# Patient Record
Sex: Female | Born: 1975 | Race: Black or African American | Hispanic: No | Marital: Single | Smoking: Never smoker
Health system: Southern US, Community
[De-identification: ages and names within clinical notes are randomized; demographics above are authoritative.]

## PROBLEM LIST (undated history)

## (undated) DIAGNOSIS — Z903 Acquired absence of stomach [part of]: Secondary | ICD-10-CM

## (undated) DIAGNOSIS — B9689 Other specified bacterial agents as the cause of diseases classified elsewhere: Secondary | ICD-10-CM

## (undated) DIAGNOSIS — N760 Acute vaginitis: Secondary | ICD-10-CM

## (undated) HISTORY — DX: Acquired absence of stomach (part of): Z90.3

## (undated) HISTORY — PX: REMOVAL OF URINARY SLING: SHX6218

## (undated) HISTORY — PX: TUBAL LIGATION: SHX77

## (undated) HISTORY — PX: INCONTINENCE SURGERY: SHX676

## (undated) HISTORY — PX: ABDOMINAL HYSTERECTOMY: SHX81

---

## 2006-04-20 ENCOUNTER — Emergency Department (HOSPITAL_COMMUNITY): Admission: EM | Admit: 2006-04-20 | Discharge: 2006-04-20 | Payer: Self-pay | Admitting: Emergency Medicine

## 2007-12-02 ENCOUNTER — Other Ambulatory Visit: Admission: RE | Admit: 2007-12-02 | Discharge: 2007-12-02 | Payer: Self-pay | Admitting: Family Medicine

## 2009-05-31 ENCOUNTER — Ambulatory Visit: Payer: Self-pay | Admitting: Obstetrics & Gynecology

## 2009-06-07 ENCOUNTER — Ambulatory Visit: Payer: Self-pay | Admitting: Obstetrics & Gynecology

## 2009-08-10 ENCOUNTER — Ambulatory Visit: Payer: Self-pay | Admitting: Obstetrics & Gynecology

## 2009-08-16 ENCOUNTER — Ambulatory Visit: Payer: Self-pay | Admitting: Obstetrics & Gynecology

## 2013-03-19 ENCOUNTER — Ambulatory Visit: Payer: Self-pay | Admitting: Family Medicine

## 2013-03-19 LAB — WET PREP, GENITAL

## 2014-10-06 ENCOUNTER — Ambulatory Visit: Payer: Self-pay | Admitting: Specialist

## 2014-10-11 ENCOUNTER — Ambulatory Visit: Payer: Self-pay | Admitting: Specialist

## 2014-10-20 ENCOUNTER — Ambulatory Visit: Payer: Self-pay | Admitting: Specialist

## 2014-12-10 ENCOUNTER — Ambulatory Visit
Admission: EM | Admit: 2014-12-10 | Discharge: 2014-12-10 | Disposition: A | Discharge status: Home / Self Care | Payer: BLUE CROSS/BLUE SHIELD | Attending: Internal Medicine | Admitting: Internal Medicine

## 2014-12-10 DIAGNOSIS — N762 Acute vulvitis: Secondary | ICD-10-CM | POA: Diagnosis not present

## 2014-12-10 DIAGNOSIS — N760 Acute vaginitis: Secondary | ICD-10-CM

## 2014-12-10 DIAGNOSIS — L292 Pruritus vulvae: Secondary | ICD-10-CM | POA: Diagnosis present

## 2014-12-10 DIAGNOSIS — R32 Unspecified urinary incontinence: Secondary | ICD-10-CM | POA: Insufficient documentation

## 2014-12-10 LAB — CHLAMYDIA/NGC RT PCR (ARMC ONLY)
Chlamydia Tr: NOT DETECTED
N gonorrhoeae: NOT DETECTED

## 2014-12-10 LAB — WET PREP, GENITAL
Clue Cells Wet Prep HPF POC: NONE SEEN
TRICH WET PREP: NONE SEEN
Yeast Wet Prep HPF POC: NONE SEEN

## 2014-12-10 MED ORDER — HYDROCORTISONE VALERATE 0.2 % EX OINT: 1.0000 "application " | TOPICAL_OINTMENT | Freq: Two times a day (BID) | CUTANEOUS | Status: AC

## 2014-12-10 NOTE — ED Provider Notes (Signed)
CSN: [id number]     Arrival date & time 12/10/14  0913 History   None    Chief Complaint  Patient presents with  . Vaginal Itching   HPI Patient presents with three-week history of intermittent severe vaginal itching, not internal, primarily at the introitus. Itching was severe initially, lasted for about a week, subsided, and now has returned in the last week. No unusual vaginal discharge, no odor. Chronic difficulty with some urinary incontinence. Patient describes herself as being sort of obsessive about cleanliness, because of the urinary incontinence issue. She uses an antibacterial soap; not currently using wipes. She does not douche. No change in urinary frequency, no dysuria. No change in bowel habits. No vaginal bleeding (status post hysterectomy, still has ovaries). No hot flashes. Feels okay otherwise, no fever. No nausea, vomiting. No abdominal or pelvic pain.   History reviewed. No pertinent past medical history. Past Surgical History  Procedure Laterality Date  . Abdominal hysterectomy    . Incontinence surgery    . Tubal ligation    . Removal of urinary sling     No family history on file. History  Substance Use Topics  . Smoking status: Never Smoker   . Smokeless tobacco: Never Used  . Alcohol Use: Yes     Comment: occasionally   OB History    No data available     Review of Systems  All other systems reviewed and are negative.   Allergies  Rocephin  Home Medications   Prior to Admission medications   Medication Sig Start Date End Date Taking? Authorizing Provider  fluticasone (FLONASE) 50 MCG/ACT nasal spray Place into both nostrils daily.   Yes Historical Provider, MD  ranitidine (ZANTAC) 150 MG capsule Take 150 mg by mouth 2 (two) times daily.   Yes Historical Provider, MD   BP 146/93 mmHg  Pulse 77  Temp(Src) 98.5 F (36.9 C) (Oral)  Ht 5' (1.524 m)  Wt 211 lb (95.709 kg)  BMI 41.21 kg/m2  SpO2 100% Physical Exam  Constitutional: She is  oriented to person, place, and time. No distress.  Alert, nicely groomed  HENT:  Head: Atraumatic.  Eyes:  Conjugate gaze, no eye redness/drainage  Neck: Neck supple.  Cardiovascular: Normal rate.   Pulmonary/Chest: No respiratory distress.  Abdominal: She exhibits no distension.  Genitourinary:  Speculum exam vaginal mucosa is unremarkable, small amount of creamy white discharge, no odor. Swabs obtained for GC/chlamydia, wet prep. Introitus is erythematous, irregular, without ulceration, without induration. Vulvar exam is unremarkable  Musculoskeletal: Normal range of motion.  No leg swelling  Neurological: She is alert and oriented to person, place, and time.  Skin: Skin is warm and dry.  No cyanosis  Nursing note and vitals reviewed.   ED Course  Procedures (including critical care time) Labs Review Labs Reviewed  WET PREP, GENITAL - Abnormal; Notable for the following:    WBC, Wet Prep HPF POC FEW (*)    All other components within normal limits  CHLAMYDIA/NGC RT PCR [facility])     Imaging Review No results found.   MDM   1. Acute vulvitis or vulvovaginitis    Presumed irritant source, from the antibiotic soap. Have encouraged patient to use only water to wash, particularly until symptoms subside. No body washes, wipes, hygiene products. Rx for Westcort ointment twice a day for 10 days. If symptoms persist, follow up with PCP/Sarah Gauger or GYN to discuss further diagnosis and treatment options. Might benefit from topical estrogen.  Eustace MooreLaura W Ethyn Schetter, MD 12/10/14 1125

## 2014-12-10 NOTE — Discharge Instructions (Signed)
Use a small amount of the hydrocortisone ointment twice daily to the itchy area for 10 days only.  If itching/irritation persist, see your PCP/Sarah Gauger or followup with a gynecologist for discussion of further diagnosis/treatment options.  Vaginitis Vaginitis is an inflammation of the vagina. It can happen when the normal bacteria and yeast in the vagina grow too much. There are different types. Treatment will depend on the type you have. HOME CARE  Take all medicines as told by your doctor.  Keep your vagina area clean and dry. Avoid soap. Rinse the area with water.  Avoid washing and cleaning out the vagina (douching).  Do not use tampons or have sex (intercourse) until your treatment is done.  Wipe from front to back after going to the restroom.  Wear cotton underwear.  Avoid wearing underwear while you sleep until your vaginitis is gone.  Avoid tight pants. Avoid underwear or nylons without a cotton panel.  Take off wet clothing (such as a bathing suit) as soon as you can.  Use mild, unscented products. Avoid fabric softeners and scented:  Feminine sprays.  Laundry detergents.  Tampons.  Soaps or bubble baths.  Practice safe sex and use condoms. GET HELP RIGHT AWAY IF:   You have belly (abdominal) pain.  You have a fever or lasting symptoms for more than 2-3 days.  You have a fever and your symptoms suddenly get worse. MAKE SURE YOU:   Understand these instructions.  Will watch this condition.  Will get help right away if you are not doing well or get worse. Document Released: 10/17/2008 Document Revised: 04/14/2012 Document Reviewed: 01/01/2012 [facility] Patient Information 2015 [place]. This information is not intended to replace advice given to you by your health care provider. Make sure you discuss any questions you have with your health care provider.

## 2014-12-10 NOTE — ED Notes (Signed)
X 3 weeks. Pt denies abnormal discharge or malodor.

## 2015-08-19 ENCOUNTER — Emergency Department: Payer: BLUE CROSS/BLUE SHIELD

## 2015-08-19 ENCOUNTER — Emergency Department
Admission: EM | Admit: 2015-08-19 | Discharge: 2015-08-19 | Disposition: A | Discharge status: Home / Self Care | Payer: BLUE CROSS/BLUE SHIELD | Attending: Emergency Medicine | Admitting: Emergency Medicine

## 2015-08-19 ENCOUNTER — Encounter: Payer: Self-pay | Admitting: Emergency Medicine

## 2015-08-19 DIAGNOSIS — R1011 Right upper quadrant pain: Secondary | ICD-10-CM

## 2015-08-19 DIAGNOSIS — M549 Dorsalgia, unspecified: Secondary | ICD-10-CM | POA: Insufficient documentation

## 2015-08-19 DIAGNOSIS — F419 Anxiety disorder, unspecified: Secondary | ICD-10-CM | POA: Insufficient documentation

## 2015-08-19 DIAGNOSIS — Z3202 Encounter for pregnancy test, result negative: Secondary | ICD-10-CM | POA: Insufficient documentation

## 2015-08-19 DIAGNOSIS — Z79899 Other long term (current) drug therapy: Secondary | ICD-10-CM | POA: Insufficient documentation

## 2015-08-19 DIAGNOSIS — R1013 Epigastric pain: Secondary | ICD-10-CM | POA: Diagnosis present

## 2015-08-19 LAB — URINALYSIS COMPLETE WITH MICROSCOPIC (ARMC ONLY)
Bacteria, UA: NONE SEEN
Bilirubin Urine: NEGATIVE
GLUCOSE, UA: NEGATIVE mg/dL
HGB URINE DIPSTICK: NEGATIVE
Ketones, ur: NEGATIVE mg/dL
Leukocytes, UA: NEGATIVE
Nitrite: NEGATIVE
PROTEIN: NEGATIVE mg/dL
Specific Gravity, Urine: 1.024 (ref 1.005–1.030)
pH: 5 (ref 5.0–8.0)

## 2015-08-19 LAB — CBC
HEMATOCRIT: 41.3 % (ref 35.0–47.0)
HEMOGLOBIN: 13.1 g/dL (ref 12.0–16.0)
MCH: 26 pg (ref 26.0–34.0)
MCHC: 31.8 g/dL — ABNORMAL LOW (ref 32.0–36.0)
MCV: 81.9 fL (ref 80.0–100.0)
Platelets: 176 10*3/uL (ref 150–440)
RBC: 5.04 MIL/uL (ref 3.80–5.20)
RDW: 14.9 % — ABNORMAL HIGH (ref 11.5–14.5)
WBC: 11.8 10*3/uL — AB (ref 3.6–11.0)

## 2015-08-19 LAB — COMPREHENSIVE METABOLIC PANEL
ALT: 95 U/L — AB (ref 14–54)
ANION GAP: 7 (ref 5–15)
AST: 199 U/L — ABNORMAL HIGH (ref 15–41)
Albumin: 4.2 g/dL (ref 3.5–5.0)
Alkaline Phosphatase: 74 U/L (ref 38–126)
BUN: 18 mg/dL (ref 6–20)
CHLORIDE: 103 mmol/L (ref 101–111)
CO2: 26 mmol/L (ref 22–32)
Calcium: 9.5 mg/dL (ref 8.9–10.3)
Creatinine, Ser: 0.88 mg/dL (ref 0.44–1.00)
GFR calc non Af Amer: >60 mL/min (ref >60)
Glucose, Bld: 90 mg/dL (ref 65–99)
POTASSIUM: 3.5 mmol/L (ref 3.5–5.1)
SODIUM: 136 mmol/L (ref 135–145)
Total Bilirubin: 0.9 mg/dL (ref 0.3–1.2)
Total Protein: 8 g/dL (ref 6.5–8.1)

## 2015-08-19 LAB — TROPONIN I: Troponin I: <0.03 ng/mL (ref <0.031)

## 2015-08-19 LAB — POCT PREGNANCY, URINE: Preg Test, Ur: NEGATIVE

## 2015-08-19 LAB — LIPASE, BLOOD: LIPASE: 22 U/L (ref 11–51)

## 2015-08-19 MED ORDER — IOHEXOL 240 MG/ML SOLN
25.0000 mL | Freq: Once | INTRAMUSCULAR | Status: AC | PRN
  Administered 2015-08-19: 25 mL via ORAL

## 2015-08-19 MED ORDER — IOHEXOL 300 MG/ML  SOLN
100.0000 mL | Freq: Once | INTRAMUSCULAR | Status: AC | PRN
  Administered 2015-08-19: 100 mL via INTRAVENOUS

## 2015-08-19 NOTE — ED Notes (Signed)
Pt. States epigastric pain that started at 6 am today.  Pt. States having gastric sleeve placed in June of last year.  Pt. States having hx of acid reflux before surgery.

## 2015-08-19 NOTE — Discharge Instructions (Signed)
You were evaluated for upper abdominal pain and although no certain cause was found, your exam and evaluation are reassuring today in the emergency department.  Return to the emergency department for any new or worsening condition including any worsening abdominal pain, fever, black or bloody stool, vomiting blood, or any other symptoms concerning to you.   Abdominal Pain, Adult Many things can cause belly (abdominal) pain. Most times, the belly pain is not dangerous. Many cases of belly pain can be watched and treated at home. HOME CARE   Do not take medicines that help you go poop (laxatives) unless told to by your doctor.  Only take medicine as told by your doctor.  Eat or drink as told by your doctor. Your doctor will tell you if you should be on a special diet. GET HELP IF:  You do not know what is causing your belly pain.  You have belly pain while you are sick to your stomach (nauseous) or have runny poop (diarrhea).  You have pain while you pee or poop.  Your belly pain wakes you up at night.  You have belly pain that gets worse or better when you eat.  You have belly pain that gets worse when you eat fatty foods.  You have a fever. GET HELP RIGHT AWAY IF:   The pain does not go away within 2 hours.  You keep throwing up (vomiting).  The pain changes and is only in the right or left part of the belly.  You have bloody or tarry looking poop. MAKE SURE YOU:   Understand these instructions.  Will watch your condition.  Will get help right away if you are not doing well or get worse.   This information is not intended to replace advice given to you by your health care provider. Make sure you discuss any questions you have with your health care provider.   Document Released: 01/07/2008 Document Revised: 08/11/2014 Document Reviewed: 03/30/2013 Elsevier Interactive Patient Education [employer]2016 [employer] Inc.

## 2015-08-19 NOTE — ED Notes (Signed)
Pt. States upper gastric pain radiates to rt. Flank.

## 2015-08-19 NOTE — ED Provider Notes (Signed)
[facility] Emergency Department Provider Note   ____________________________________________  Time seen: Approximately 7:20 AM I have reviewed the triage vital signs and the triage nursing note.  HISTORY  Chief Complaint Abdominal Pain   Historian Patient  HPI Jasmine Simon is a 40 y.o. female with a history of gastric sleeve surgery who is here with epigastric and right upper quadrant pain that started when she woke up this morning. Gastric sleeve was placed last June. Prior to that she had acid reflux, but states this episode did not feel like acid reflux. States it was a squeezing epigastric and right upper quadrant pain that was moderate to severe. She to get a shower it was still not gone and so she came in for evaluation. She states it is almost completely relieved, but still some dull pain in the right upper quadrant now. No fever or vomiting. No stool changes. No alleviating or aggravating factors.    History reviewed. No pertinent past medical history.  There are no active problems to display for this patient.   Past Surgical History  Procedure Laterality Date  . Abdominal hysterectomy    . Incontinence surgery    . Tubal ligation    . Removal of urinary sling      Current Outpatient Rx  Name  Route  Sig  Dispense  Refill  . Biotin 10 MG TABS   Oral   Take 10 mg by mouth daily.         . calcium-vitamin D (OSCAL WITH D) 500-200 MG-UNIT tablet   Oral   Take 1 tablet by mouth daily.         . fluticasone (FLONASE) 50 MCG/ACT nasal spray   Each Nare   Place 1 spray into both nostrils daily as needed for allergies or rhinitis.          . Multiple Vitamin (MULTI-VITAMINS) TABS   Oral   Take 1 tablet by mouth daily.         . ranitidine (ZANTAC) 150 MG capsule   Oral   Take 150 mg by mouth 2 (two) times daily.           Allergies Rocephin  Family History  Problem Relation Age of Onset  . Heart failure Mother   .  Cancer Other     Social History Social History  Substance Use Topics  . Smoking status: Never Smoker   . Smokeless tobacco: Never Used  . Alcohol Use: Yes     Comment: occasionally    Review of Systems  Constitutional: Negative for fever. Eyes: Negative for visual changes. ENT: Negative for sore throat. Cardiovascular: Negative for chest pain. Respiratory: Negative for shortness of breath. Gastrointestinal: Negative for  vomiting and diarrhea. Genitourinary: Negative for dysuria. Musculoskeletal: Initially felt mid back pain and then started to feel the abdominal pain. Skin: Negative for rash. Neurological: Negative for headache. 10 point Review of Systems otherwise negative ____________________________________________   PHYSICAL EXAM:  VITAL SIGNS: ED Triage Vitals  Enc Vitals Group     BP 08/19/15 0656 132/93 mmHg     Pulse Rate 08/19/15 0656 100     Resp 08/19/15 0656 22     Temp 08/19/15 0656 98.3 F (36.8 C)     Temp Source 08/19/15 0656 Oral     SpO2 08/19/15 0656 100 %     Weight 08/19/15 0656 166 lb (75.297 kg)     Height 08/19/15 0656 5' (1.524 m)  Head Cir --      Peak Flow --      Pain Score 08/19/15 0658 8     Pain Loc --      Pain Edu? --      Excl. in GC? --      Constitutional: Alert and oriented. Well appearing and in no distress. Somewhat anxious about receiving an IV Eyes: Conjunctivae are normal. PERRL. Normal extraocular movements. ENT   Head: Normocephalic and atraumatic.   Nose: No congestion/rhinnorhea.   Mouth/Throat: Mucous membranes are moist.   Neck: No stridor. Cardiovascular/Chest: Normal rate, regular rhythm.  No murmurs, rubs, or gallops. Respiratory: Normal respiratory effort without tachypnea nor retractions. Breath sounds are clear and equal bilaterally. No wheezes/rales/rhonchi. Gastrointestinal: Soft. No distention, no guarding, no rebound. Moderate tenderness in the epigastrium and right upper quadrant. No  lower abdominal tenderness palpation.  Genitourinary/rectal:Deferred Musculoskeletal: Nontender with normal range of motion in all extremities. No joint effusions.  No lower extremity tenderness.  No edema. Neurologic:  Normal speech and language. No gross or focal neurologic deficits are appreciated. Skin:  Skin is warm, dry and intact. No rash noted. Psychiatric: Simon and affect are normal. Speech and behavior are normal. Patient exhibits appropriate insight and judgment.  ____________________________________________   EKG I, Governor Rooksebecca Noor Witte, MD, the attending physician have personally viewed and interpreted all ECGs.  91 bpm. Normal sinus rhythm. Narrow QRS. Normal axis. Normal ST and T-wave ____________________________________________  LABS (pertinent positives/negatives)  Lipase 22 Copy intensive metabolic panel significant for AST 199, a LT 95 otherwise within normal limits White blood count 11.8, hemoglobin 13.1 and platelet count 136 Troponin less than 0.03 Urinalysis negative  ____________________________________________  RADIOLOGY All Xrays were viewed by me. Imaging interpreted by Radiologist.  Abdominal x-ray:  Nonspecific, nonobstructive bowel gas pattern. No acute cardiopulmonary disease  CT abdomen and pelvis with contrast:IMPRESSION: No acute findings within the abdomen or pelvis.  Moderate to large stool burden noted; suggest clinical correlation for possible constipation.  Right lower quadrant ultrasound:  IMPRESSION: Normal gallbladder. No liver abnormality. __________________________________________  PROCEDURES  Procedure(s) performed: None  Critical Care performed: None  ____________________________________________   ED COURSE / ASSESSMENT AND PLAN  CONSULTATIONS: None  Pertinent labs & imaging results that were available during my care of the patient were reviewed by me and considered in my medical decision making (see chart for  details).  Although the pain has significantly eased off, she still has tenderness in the epigastrium and right upper quadrant on palpation. Labs and x-ray will be obtained initially. Consider complication from gastric sleeve versus GERD versus biliary etiology.  Minimally elevated white blood cell count, and AST LT minimally elevated as well. I discussed with patient obtaining CT of abdomen and pelvis. We discussed risk versus benefit and chose to proceed.  CT the abdomen showed no acute source for pain or complication of the gastric sleeve.  Given the elevated LFTs and elevated white blood cell count and right upper quadrant pain, I did follow this with imaging of the gallbladder with ultrasound.  ----------------------------------------- 3:25 PM on 08/19/2015 -----------------------------------------  Patient continues to feel well. I discussed her reassuring results on labs and imaging. I feel comfortable at this point having her discharged home and follow up with her primary care physician.  Patient / Family / Caregiver informed of clinical course, medical decision-making process, and agree with plan.   I discussed return precautions, follow-up instructions, and discharged instructions with patient and/or family.  ___________________________________________   FINAL  CLINICAL IMPRESSION(S) / ED DIAGNOSES   Final diagnoses:  RUQ pain              Note: This dictation was prepared with Dragon dictation. Any transcriptional errors that result from this process are unintentional   Governor Rooks, MD 08/19/15 1526

## 2016-04-08 IMAGING — CR DG ABDOMEN ACUTE W/ 1V CHEST
4 series · 4 of 4 positions shown · non-contrast
Comparison: Chest x-ray 10/06/2014

CLINICAL DATA: Severe epigastric pain and mid to upper back pain
beginning this morning. Gastric sleeve surgery January 2015.

EXAM:
DG ABDOMEN ACUTE W/ 1V CHEST

[chest pa]
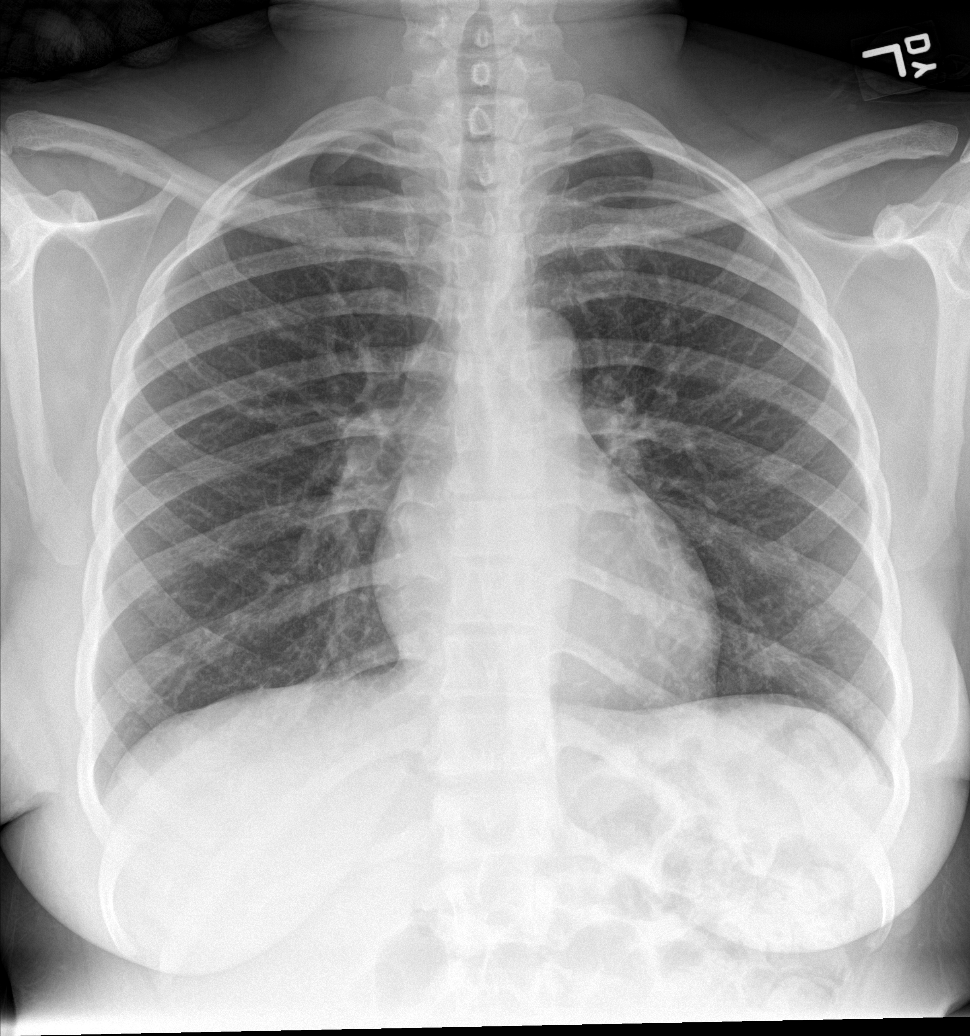

[abdomen erect]
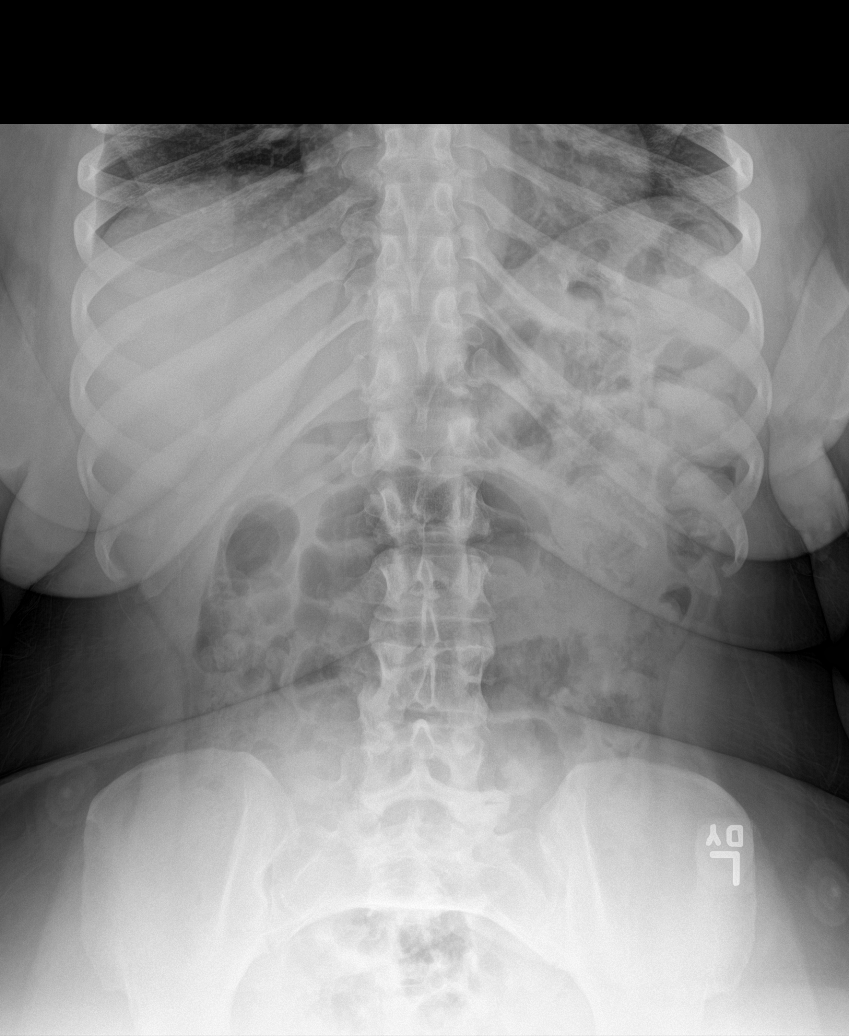

[abdomen supine (1 of 2)]
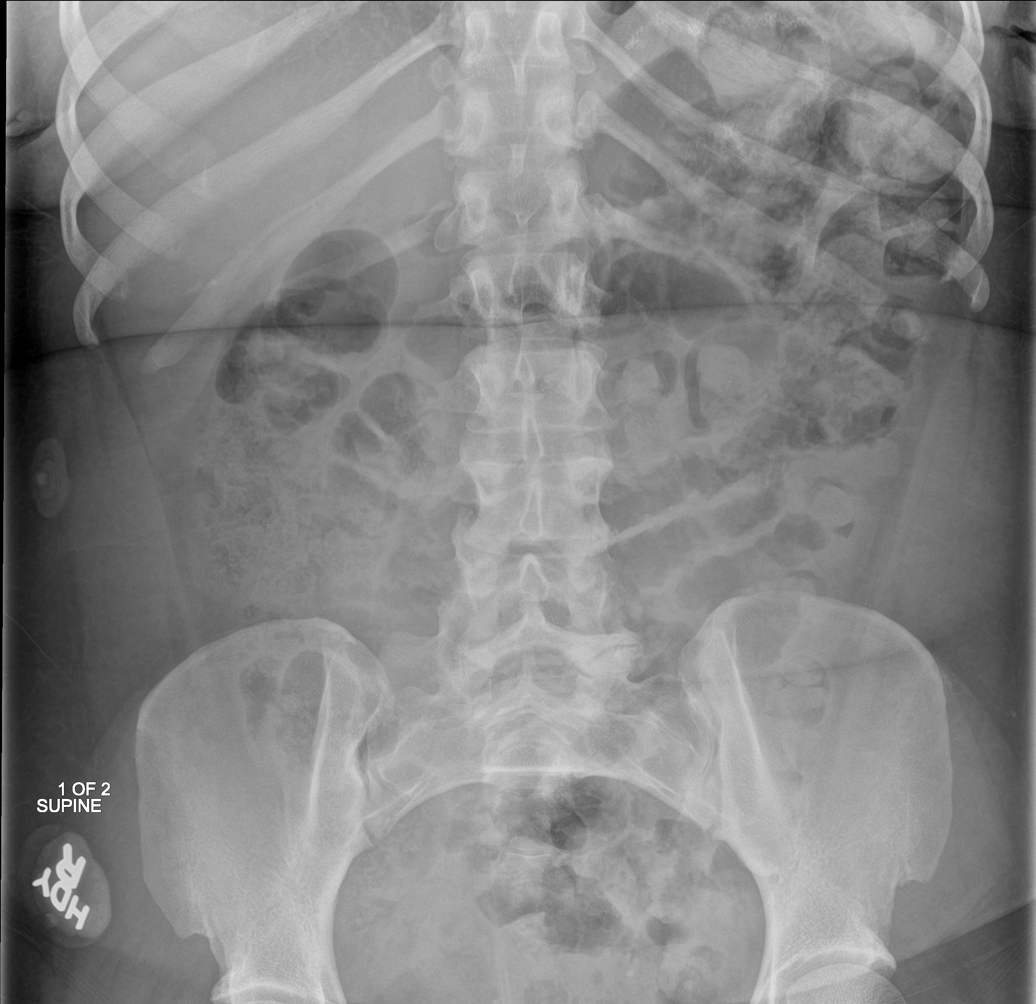

[abdomen supine (2 of 2)]
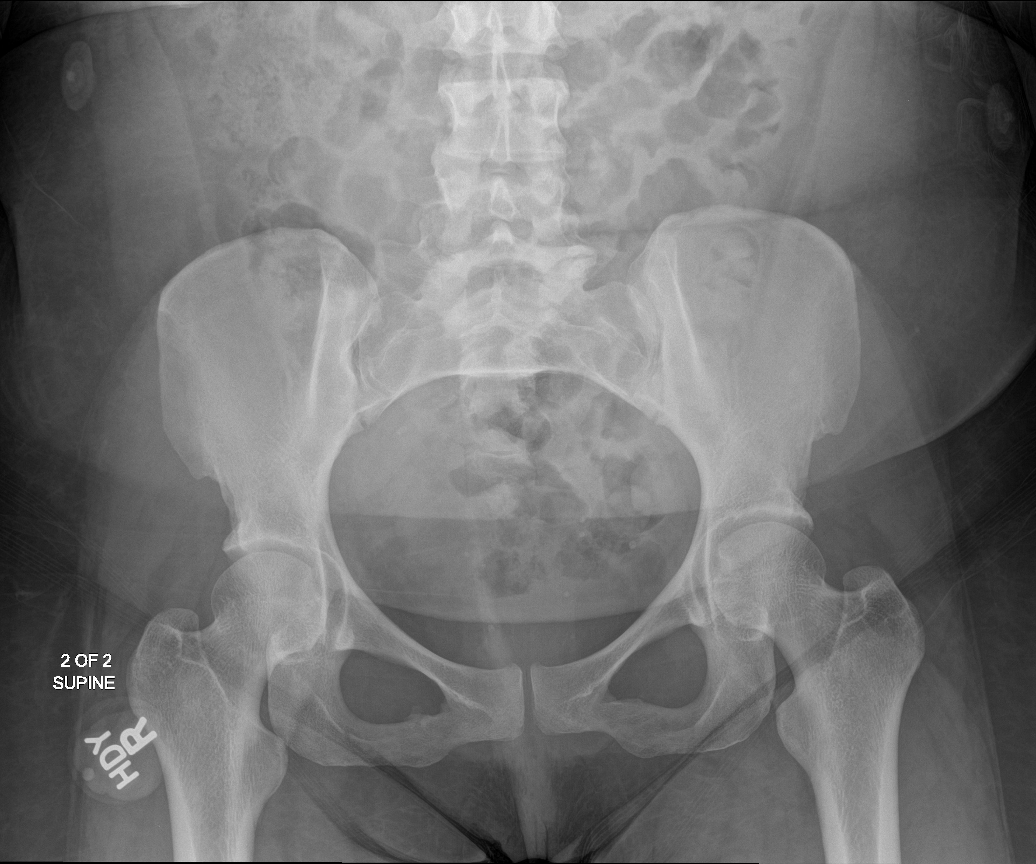

[4 of 4 positions shown; findings below may reference images not displayed]

FINDINGS: Lungs are adequately inflated and otherwise clear. Cardiomediastinal
silhouette and remainder of the chest is unchanged.

Abdominal films demonstrate air in stool throughout the colon. There
are a few air-filled nondilated small bowel loops over the left
abdomen. No free peritoneal air. No air-fluid levels. Remaining
bones and soft tissues are unremarkable.
IMPRESSION: Nonspecific, nonobstructive bowel gas pattern.

No acute cardiopulmonary disease.

## 2016-09-05 DIAGNOSIS — Z5181 Encounter for therapeutic drug level monitoring: Secondary | ICD-10-CM | POA: Diagnosis not present

## 2016-09-05 DIAGNOSIS — Z9884 Bariatric surgery status: Secondary | ICD-10-CM | POA: Diagnosis not present

## 2016-09-05 DIAGNOSIS — R638 Other symptoms and signs concerning food and fluid intake: Secondary | ICD-10-CM | POA: Diagnosis not present

## 2016-09-05 DIAGNOSIS — K912 Postsurgical malabsorption, not elsewhere classified: Secondary | ICD-10-CM | POA: Diagnosis not present

## 2016-09-09 DIAGNOSIS — Z90710 Acquired absence of both cervix and uterus: Secondary | ICD-10-CM | POA: Diagnosis not present

## 2016-09-09 DIAGNOSIS — Z713 Dietary counseling and surveillance: Secondary | ICD-10-CM | POA: Diagnosis not present

## 2016-09-09 DIAGNOSIS — R102 Pelvic and perineal pain: Secondary | ICD-10-CM | POA: Diagnosis not present

## 2016-09-24 IMAGING — US US ABDOMEN LIMITED
1 series · 14 of 25 positions shown · non-contrast
Comparison: CT 08/19/2015

CLINICAL DATA: RIGHT upper quadrant pain.

EXAM:
US ABDOMEN LIMITED - RIGHT UPPER QUADRANT

[Series 1: us abdomen limited · 0.15mm/px · 14 of 43 slices shown]
[im 1/43]
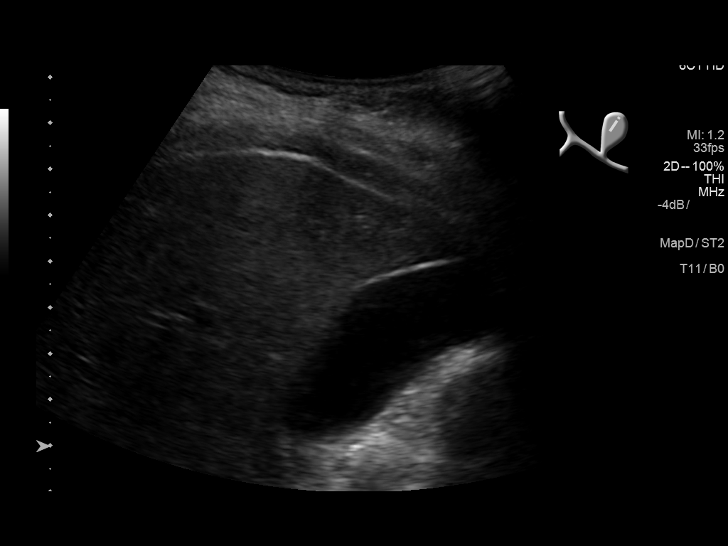
[im 4/43]
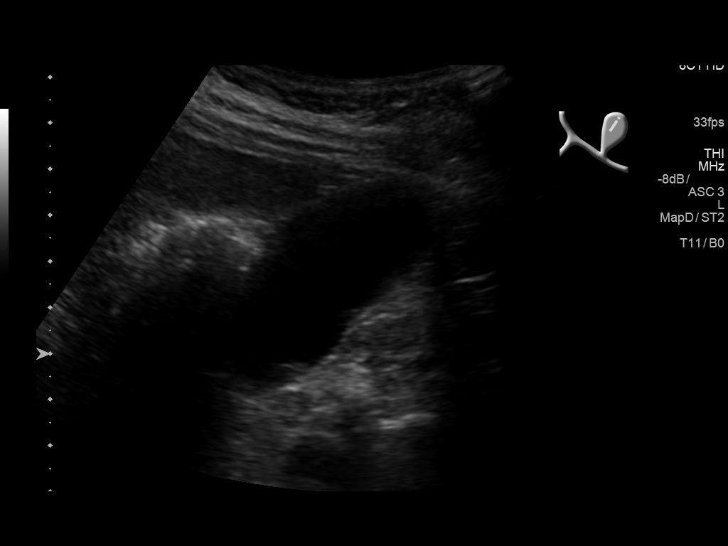
[im 8/43]
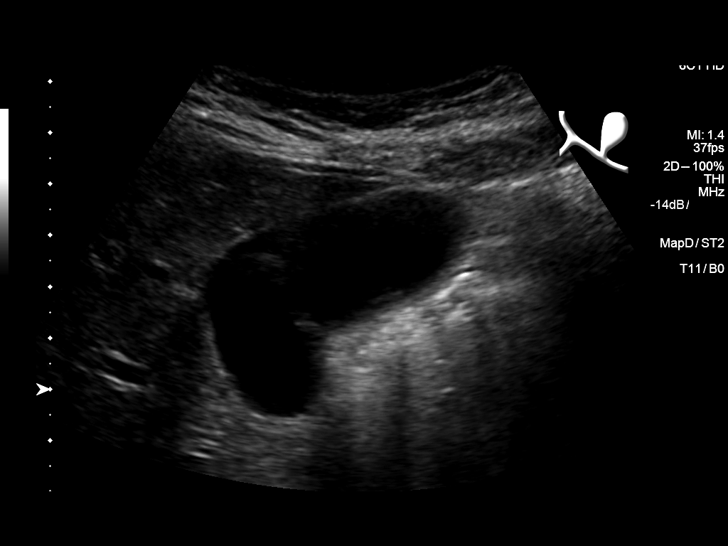
[im 11/43]
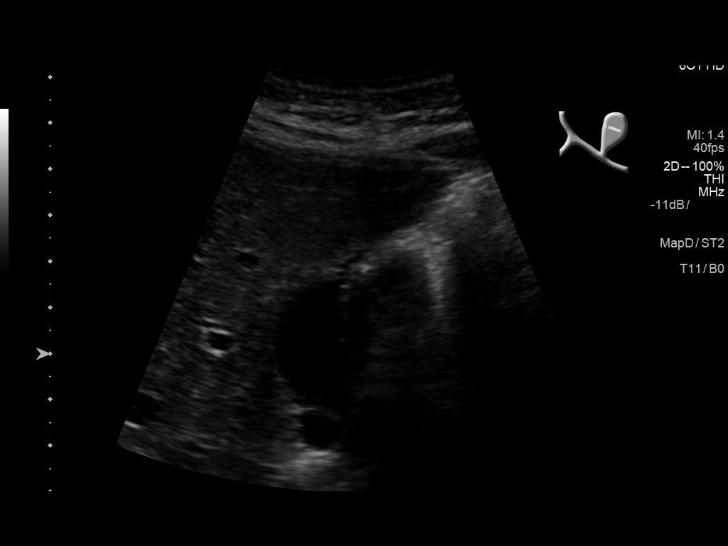
[im 15/43]
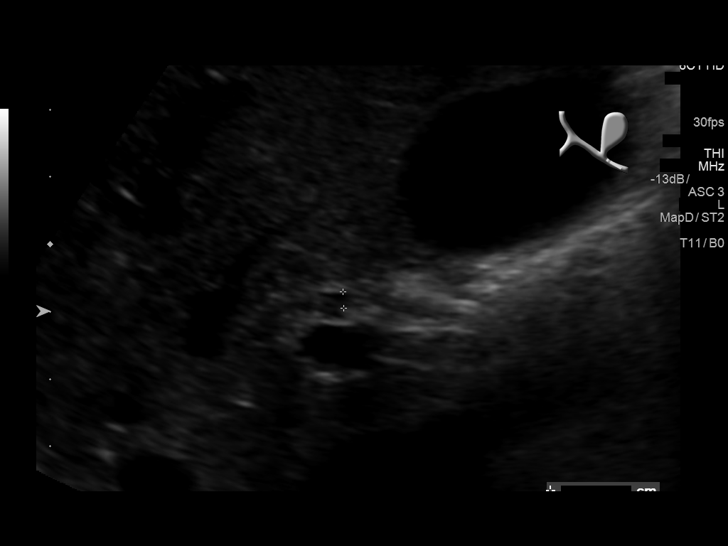
[im 16/43]
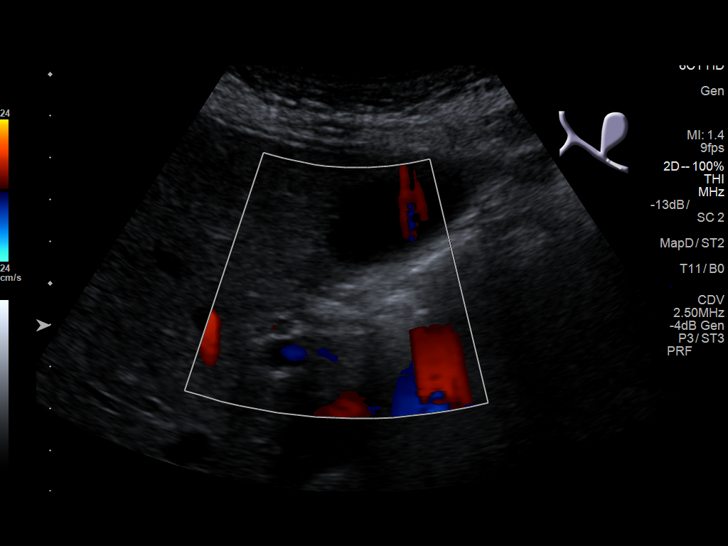
[im 20/43]
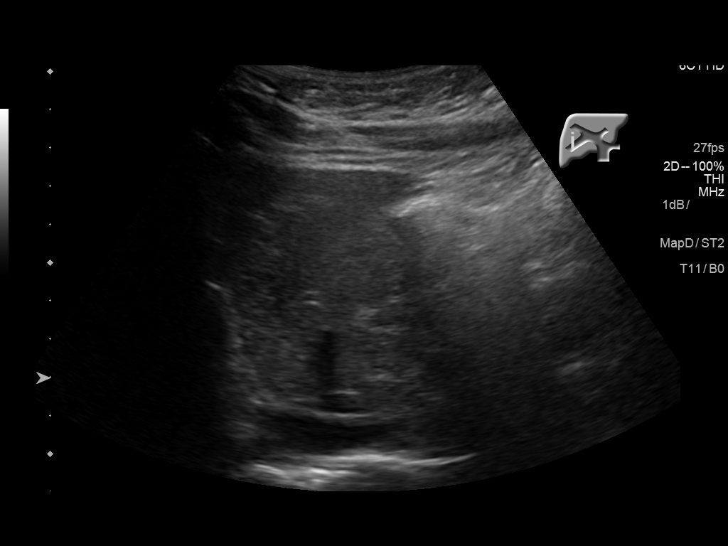
[im 23/43]
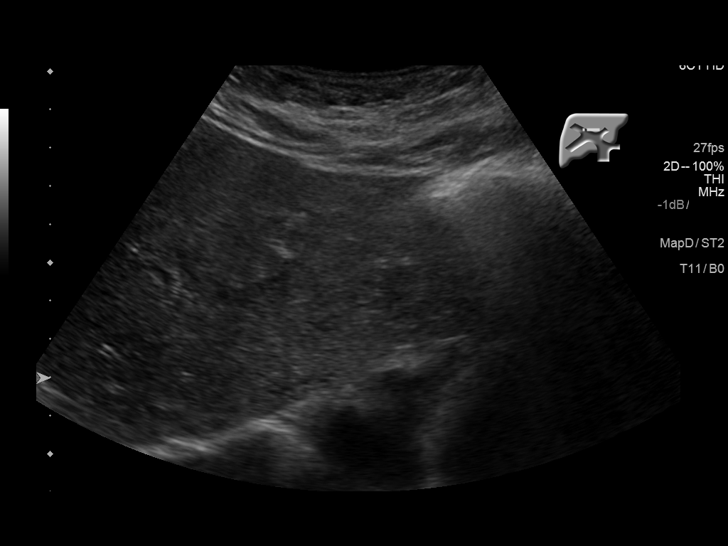
[im 27/43]
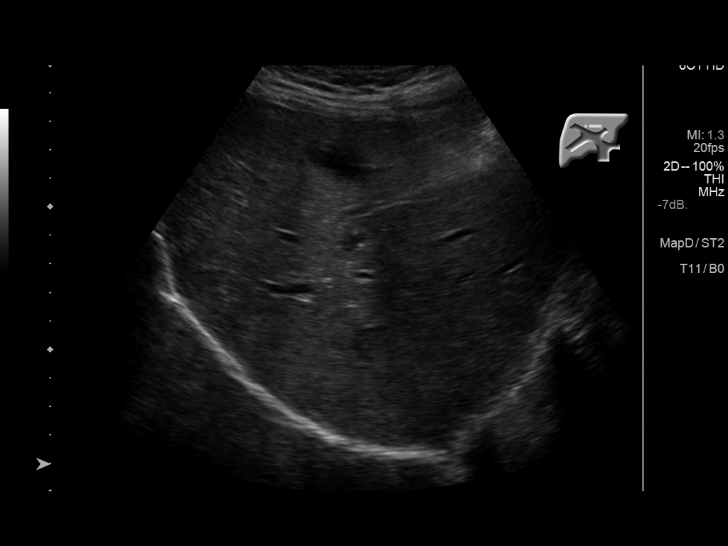
[im 29/43]
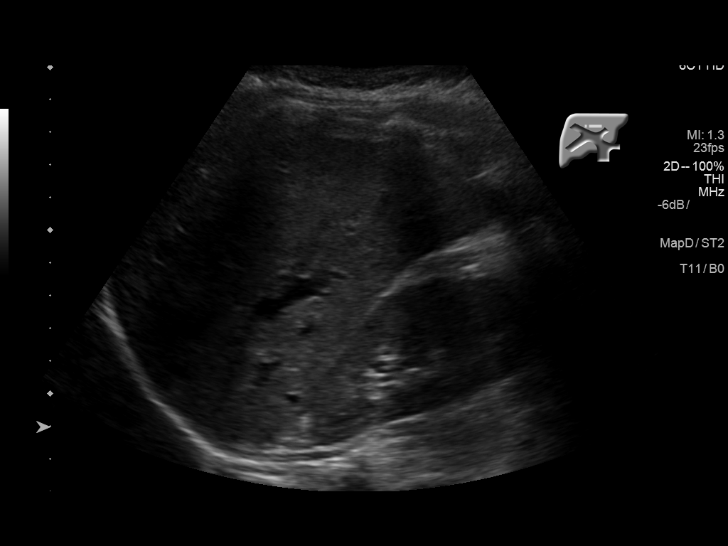
[im 32/43]
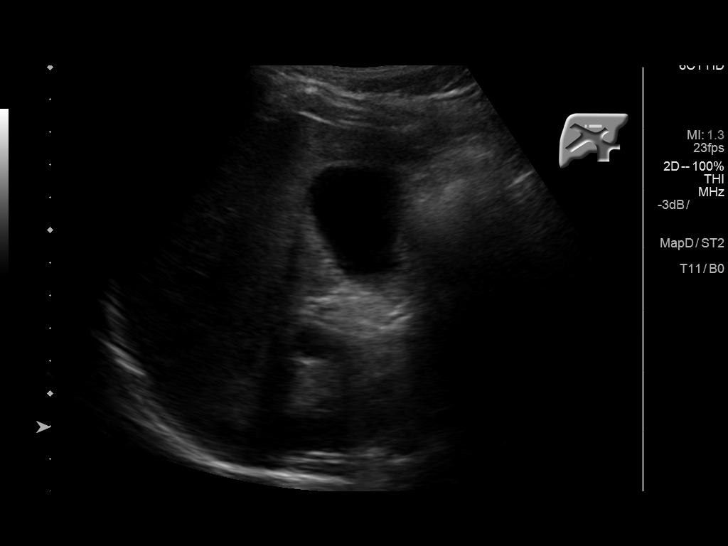
[im 36/43]
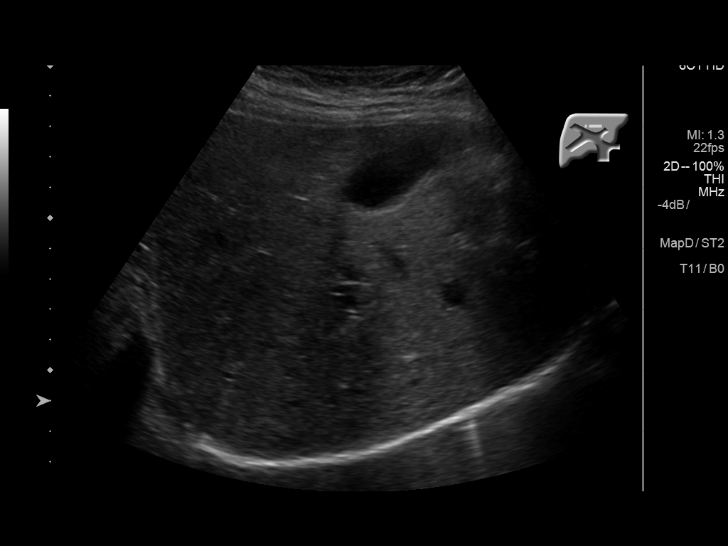
[im 39/43]
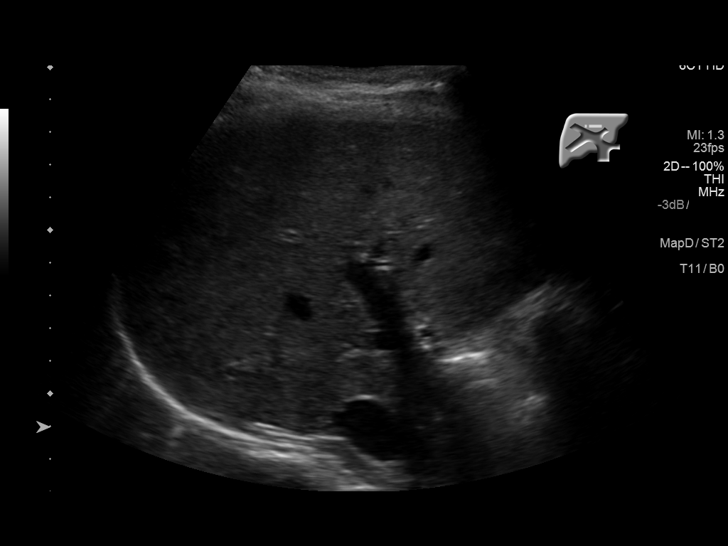
[im 43/43]
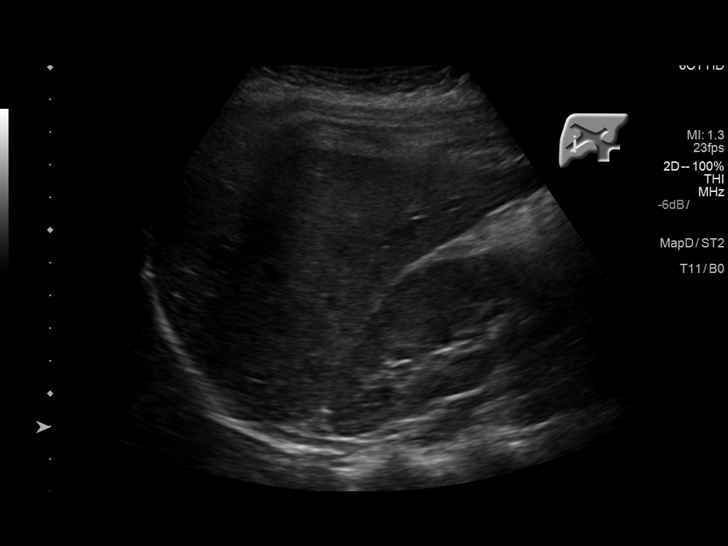

[14 of 25 positions shown; findings below may reference images not displayed]

FINDINGS: Gallbladder:

No gallstones or wall thickening visualized. No sonographic Murphy
sign noted by sonographer.

Common bile duct:

Diameter: Normal at 2.4 cm.

Liver:

No focal lesion identified. Within normal limits in parenchymal
echogenicity.
IMPRESSION: Normal gallbladder.  No liver abnormality.

## 2016-09-28 ENCOUNTER — Ambulatory Visit
Admission: EM | Admit: 2016-09-28 | Discharge: 2016-09-28 | Disposition: A | Discharge status: Home / Self Care | Payer: 59 | Attending: Family Medicine | Admitting: Family Medicine

## 2016-09-28 DIAGNOSIS — B3731 Acute candidiasis of vulva and vagina: Secondary | ICD-10-CM

## 2016-09-28 DIAGNOSIS — B373 Candidiasis of vulva and vagina: Secondary | ICD-10-CM

## 2016-09-28 LAB — WET PREP, GENITAL
Clue Cells Wet Prep HPF POC: NONE SEEN
Sperm: NONE SEEN
Trich, Wet Prep: NONE SEEN
Yeast Wet Prep HPF POC: NONE SEEN

## 2016-09-28 LAB — CHLAMYDIA/NGC RT PCR (ARMC ONLY)
Chlamydia Tr: NOT DETECTED
N gonorrhoeae: NOT DETECTED

## 2016-09-28 MED ORDER — FLUCONAZOLE 150 MG PO TABS: 150.0000 mg | ORAL_TABLET | Freq: Every day | ORAL | 1 refills | Status: DC

## 2016-09-28 NOTE — ED Provider Notes (Signed)
CSN: [id number]     Arrival date & time 09/28/16  1242 History   First MD Initiated Contact with Patient 09/28/16 1329     Chief Complaint  Patient presents with  . Vaginal Discharge   (Consider location/radiation/quality/duration/timing/severity/associated sxs/prior Treatment) HPI  41 year old female who presents with a vaginal discharge that has the consistency of cottage cheese. Had for months. She denies any itching burning there is no odor. Is any dyspareunia. Not been using any over-the-counter medications to treat this. She does on occasion use feminine sprays and products because she has an issue with her from urinary incontinence. Said no fever or chills. Denies any urinary symptoms.        History reviewed. No pertinent past medical history. Past Surgical History:  Procedure Laterality Date  . ABDOMINAL HYSTERECTOMY    . INCONTINENCE SURGERY    . REMOVAL OF URINARY SLING    . TUBAL LIGATION     Family History  Problem Relation Age of Onset  . Cancer Other   . Heart failure Mother   . Diabetes Mother   . Hypertension Mother   . Hypertension Father   . Alcohol abuse Father    Social History  Substance Use Topics  . Smoking status: Never Smoker  . Smokeless tobacco: Never Used  . Alcohol use Yes     Comment: occasionally   OB History    No data available     Review of Systems  Allergies  Rocephin [ceftriaxone sodium in dextrose]  Home Medications   Prior to Admission medications   Medication Sig Start Date End Date Taking? Authorizing Provider  Biotin 10 MG TABS Take 10 mg by mouth daily.    Historical Provider, MD  calcium-vitamin D (OSCAL WITH D) 500-200 MG-UNIT tablet Take 1 tablet by mouth daily.    Historical Provider, MD  fluconazole (DIFLUCAN) 150 MG tablet Take 1 tablet (150 mg total) by mouth daily. Use once. May repeat x 1 in 1 week PRN. 09/28/16   Lutricia Feil, PA-C  fluticasone [facility]) 50 MCG/ACT nasal spray Place 1 spray into both  nostrils daily as needed for allergies or rhinitis.     Historical Provider, MD  Multiple Vitamin (MULTI-VITAMINS) TABS Take 1 tablet by mouth daily.    Historical Provider, MD  ranitidine (ZANTAC) 150 MG capsule Take 150 mg by mouth 2 (two) times daily.    Historical Provider, MD   Meds Ordered and Administered this Visit  Medications - No data to display  BP (!) 157/95 (BP Location: Left Arm)   Pulse 86   Temp 98 F (36.7 C) (Oral)   Resp 18   Ht 5' (1.524 m)   Wt 172 lb (78 kg)   SpO2 100%   BMI 33.59 kg/m  No data found.   Physical Exam  Urgent Care Course     Procedures (including critical care time)  Labs Review Labs Reviewed  WET PREP, GENITAL - Abnormal; Notable for the following:       Result Value   WBC, Wet Prep HPF POC FEW (*)    All other components within normal limits  CHLAMYDIA/NGC RT PCR [facility] ONLY)    Imaging Review No results found.   Visual Acuity Review  Right Eye Distance:   Left Eye Distance:   Bilateral Distance:    Right Eye Near:   Left Eye Near:    Bilateral Near:         MDM   1. Vaginal yeast infection  Discharge Medication List as of 09/28/2016  2:16 PM    START taking these medications   Details  fluconazole (DIFLUCAN) 150 MG tablet Take 1 tablet (150 mg total) by mouth daily. Use once. May repeat x 1 in 1 week PRN., Starting Sun 09/28/2016, Normal      Plan: 1. Test/x-ray results and diagnosis reviewed with patient 2. rx as per orders; risks, benefits, potential side effects reviewed with patient 3. Recommend supportive treatment with Diflucan once may repeat in 1 week if necessary. After talking to the patient she has an obsession with cleaning even into the vagina itself. I have warned her against this overly aggressive cleaning as a cause of her problem. Recommended that she follow-up with her OB/GYN for further evaluation and suggestions.. 4. F/u prn if symptoms worsen or don't improve     Lutricia FeilWilliam P  Miller Limehouse, PA-C 09/28/16 1727

## 2016-09-28 NOTE — ED Triage Notes (Addendum)
Pt has discharge that is cottage cheese like, she says it has been that way for months. No itching, no burning, no odor. She has ignored it because it doesn't bother her. She is sexually active and does not use condoms.

## 2016-09-29 MED FILL — FLUCONAZOLE 150 MG TABLET: 150 | 7 days supply | Qty: 2 | Fill #0

## 2017-06-23 ENCOUNTER — Ambulatory Visit
Admission: EM | Admit: 2017-06-23 | Discharge: 2017-06-23 | Disposition: A | Discharge status: Home / Self Care | Payer: 59 | Attending: Family Medicine | Admitting: Family Medicine

## 2017-06-23 ENCOUNTER — Other Ambulatory Visit: Payer: Self-pay

## 2017-06-23 DIAGNOSIS — N760 Acute vaginitis: Secondary | ICD-10-CM

## 2017-06-23 DIAGNOSIS — B9689 Other specified bacterial agents as the cause of diseases classified elsewhere: Secondary | ICD-10-CM

## 2017-06-23 LAB — WET PREP, GENITAL
SPERM: NONE SEEN
TRICH WET PREP: NONE SEEN
WBC, Wet Prep HPF POC: NONE SEEN
YEAST WET PREP: NONE SEEN

## 2017-06-23 LAB — CHLAMYDIA/NGC RT PCR (ARMC ONLY)
Chlamydia Tr: NOT DETECTED
N gonorrhoeae: NOT DETECTED

## 2017-06-23 MED ORDER — METRONIDAZOLE 500 MG PO TABS: 500.0000 mg | ORAL_TABLET | Freq: Two times a day (BID) | ORAL | 0 refills | Status: DC

## 2017-06-23 NOTE — ED Triage Notes (Signed)
Patient complains of foul odor from vagina. Patient states that she has not been having any discharge or itching. Patient states that she has had BV before and believes this is similar.

## 2017-06-23 NOTE — Discharge Instructions (Signed)
Take medication as prescribed. Rest. Drink plenty of fluids. No alcohol with medication.   Follow up with your primary care physician this week as needed. Return to Urgent care for new or worsening concerns.

## 2017-06-23 NOTE — ED Provider Notes (Signed)
[facility] ____________________________________________  Time seen: Approximately 1:47 PM  I have reviewed the triage vital signs and the nursing notes.   HISTORY  Chief Complaint Vaginal Discharge   HPI Jasmine Simon is a 41 y.o. female presenting for evaluation of foul-smelling from vagina.  States no discharge that she has noticed.  Patient states that she has a history of bacterial vaginosis with similar presentation.  Denies known trigger.  Denies vaginal pain, vaginal lesions, pelvic pain, abdominal pain, back pain, atypical bleeding.  Patient reports she has had a partial hysterectomy.  States would like to off to have gonorrhea and Chlamydia testing, declines testing of other STDs.  No over-the-counter medications taken for the same complaints.  Denies aggravating or relieving factors.  Denies other complaints. Denies chest pain, shortness of breath, abdominal pain, dysuria, or rash. Denies recent sickness. Denies recent antibiotic use.    History reviewed. No pertinent past medical history.  There are no active problems to display for this patient.   Past Surgical History:  Procedure Laterality Date  . ABDOMINAL HYSTERECTOMY    . INCONTINENCE SURGERY    . REMOVAL OF URINARY SLING    . TUBAL LIGATION       No current facility-administered medications for this encounter.   Current Outpatient Medications:  .  Biotin 10 MG TABS, Take 10 mg by mouth daily., Disp: , Rfl:  .  calcium-vitamin D (OSCAL WITH D) 500-200 MG-UNIT tablet, Take 1 tablet by mouth daily., Disp: , Rfl:  .  fluticasone (FLONASE) 50 MCG/ACT nasal spray, Place 1 spray into both nostrils daily as needed for allergies or rhinitis. , Disp: , Rfl:  .  Multiple Vitamin (MULTI-VITAMINS) TABS, Take 1 tablet by mouth daily., Disp: , Rfl:  .  phentermine 37.5 MG capsule, Take 37.5 mg by mouth every morning., Disp: , Rfl:  .  ranitidine (ZANTAC) 150 MG capsule, Take 150 mg by mouth 2 (two) times  daily., Disp: , Rfl:  .  metroNIDAZOLE (FLAGYL) 500 MG tablet, Take 1 tablet (500 mg total) by mouth 2 (two) times daily., Disp: 14 tablet, Rfl: 0  Allergies Rocephin [ceftriaxone sodium in dextrose]  Family History  Problem Relation Age of Onset  . Cancer Other   . Heart failure Mother   . Diabetes Mother   . Hypertension Mother   . Hypertension Father   . Alcohol abuse Father     Social History Social History   Tobacco Use  . Smoking status: Never Smoker  . Smokeless tobacco: Never Used  Substance Use Topics  . Alcohol use: Yes    Comment: occasionally  . Drug use: No    Review of Systems Constitutional: No fever/chills Cardiovascular: Denies chest pain. Respiratory: Denies shortness of breath. Gastrointestinal: No abdominal pain.   Genitourinary: Negative for dysuria. As above.  Musculoskeletal: Negative for back pain.  ____________________________________________   PHYSICAL EXAM:  VITAL SIGNS: ED Triage Vitals  Enc Vitals Group     BP 06/23/17 1320 (!) 134/91     Pulse Rate 06/23/17 1320 85     Resp 06/23/17 1320 18     Temp 06/23/17 1320 98.7 F (37.1 C)     Temp Source 06/23/17 1320 Oral     SpO2 06/23/17 1320 100 %     Weight 06/23/17 1318 164 lb (74.4 kg)     Height 06/23/17 1318 5\' 1"  (1.549 m)     Head Circumference --      Peak Flow --  Pain Score 06/23/17 1318 0     Pain Loc --      Pain Edu? --      Excl. in GC? --     Constitutional: Alert and oriented. Well appearing and in no acute distress. Cardiovascular: Normal rate, regular rhythm. Grossly normal heart sounds.  Good peripheral circulation. Respiratory: Normal respiratory effort without tachypnea nor retractions. Breath sounds are clear and equal bilaterally. No wheezes, rales, rhonchi. Gastrointestinal: Soft and nontender. Marland Kitchen. No CVA tenderness. Pelvic: Exam completed with policy may present as chaperone.  External: Normal appearance, no rash or lesion.  Speculum: Cervix was  surgically absent, moderate amount of thick white vaginal discharge, no bleeding, no foreign body. Musculoskeletal:  No midline cervical, thoracic or lumbar tenderness to palpation.  Neurologic:  Normal speech and language. No gross focal neurologic deficits are appreciated. Speech is normal. No gait instability.  Skin:  Skin is warm, dry and intact. No rash noted. Psychiatric: Mood and affect are normal. Speech and behavior are normal. Patient exhibits appropriate insight and judgment   ___________________________________________   LABS (all labs ordered are listed, but only abnormal results are displayed)  Labs Reviewed  WET PREP, GENITAL - Abnormal; Notable for the following components:      Result Value   Clue Cells Wet Prep HPF POC PRESENT (*)    All other components within normal limits  CHLAMYDIA/NGC RT PCR ([facility] ONLY)   ____________________________________________  PROCEDURES Procedures   INITIAL IMPRESSION / ASSESSMENT AND PLAN / ED COURSE  Pertinent labs & imaging results that were available during my care of the patient were reviewed by me and considered in my medical decision making (see chart for details).  Well-appearing patient.  No acute distress.  Positive clue cells, will treat patient with oral Flagyl.  Counseled no alcohol with antibiotic.  Declines other STD testing.  Encouraged pelvic rest, avoidance of possible triggers, monitoring and follow-up as needed.Discussed indication, risks and benefits of medications with patient.  Discussed follow up with Primary care physician this week. Discussed follow up and return parameters including no resolution or any worsening concerns. Patient verbalized understanding and agreed to plan.   ____________________________________________   FINAL CLINICAL IMPRESSION(S) / ED DIAGNOSES  Final diagnoses:  BV (bacterial vaginosis)     ED Discharge Orders        Ordered    metroNIDAZOLE (FLAGYL) 500 MG tablet  2  times daily     06/23/17 1354       Note: This dictation was prepared with Dragon dictation along with smaller phrase technology. Any transcriptional errors that result from this process are unintentional.         Renford DillsMiller, Danne Scardina, NP 06/23/17 1400

## 2017-12-03 ENCOUNTER — Ambulatory Visit
Admission: EM | Admit: 2017-12-03 | Discharge: 2017-12-03 | Disposition: A | Discharge status: Home / Self Care | Payer: Commercial Managed Care - PPO | Attending: Family Medicine | Admitting: Family Medicine

## 2017-12-03 ENCOUNTER — Other Ambulatory Visit: Payer: Self-pay

## 2017-12-03 DIAGNOSIS — B9689 Other specified bacterial agents as the cause of diseases classified elsewhere: Secondary | ICD-10-CM

## 2017-12-03 DIAGNOSIS — N760 Acute vaginitis: Secondary | ICD-10-CM

## 2017-12-03 LAB — WET PREP, GENITAL
Sperm: NONE SEEN
Trich, Wet Prep: NONE SEEN
YEAST WET PREP: NONE SEEN

## 2017-12-03 MED ORDER — METRONIDAZOLE 500 MG PO TABS: 500.0000 mg | ORAL_TABLET | Freq: Two times a day (BID) | ORAL | 0 refills | Status: DC

## 2017-12-03 MED ORDER — FLUCONAZOLE 150 MG PO TABS: 150.0000 mg | ORAL_TABLET | Freq: Once | ORAL | 1 refills | Status: AC

## 2017-12-03 NOTE — ED Provider Notes (Signed)
[facility]    CSN: [id number] Arrival date & time: 12/03/17  1257  History   Chief Complaint Chief Complaint  Patient presents with  . Vaginal Itching   HPI  42 year old female presents with vaginal itching.  Patient reports that this started last week and has continued to be bothersome.  She reports vaginal itching.  No discharge.  She denies any concerns about STDs.  No abdominal pain.  No other associated symptoms.  No other complaints/concerns at this time.  Social History Social History   Tobacco Use  . Smoking status: Never Smoker  . Smokeless tobacco: Never Used  Substance Use Topics  . Alcohol use: Yes    Comment: occasionally  . Drug use: No    Allergies   Rocephin [ceftriaxone sodium in dextrose]   Review of Systems Review of Systems  Gastrointestinal: Negative.   Genitourinary: Negative for vaginal discharge.       Vaginal itching.   Physical Exam Triage Vital Signs ED Triage Vitals  Enc Vitals Group     BP 12/03/17 1314 (!) 153/98     Pulse Rate 12/03/17 1314 80     Resp 12/03/17 1314 17     Temp 12/03/17 1314 98.5 F (36.9 C)     Temp Source 12/03/17 1314 Oral     SpO2 12/03/17 1314 100 %     Weight 12/03/17 1313 172 lb (78 kg)     Height 12/03/17 1313 5' (1.524 m)     Head Circumference --      Peak Flow --      Pain Score 12/03/17 1312 0     Pain Loc --      Pain Edu? --      Excl. in GC? --    Updated Vital Signs BP (!) 153/98 (BP Location: Left Arm)   Pulse 80   Temp 98.5 F (36.9 C) (Oral)   Resp 17   Ht 5' (1.524 m)   Wt 172 lb (78 kg)   SpO2 100%   BMI 33.59 kg/m      Physical Exam  Constitutional: She is oriented to person, place, and time. She appears well-developed. No distress.  HENT:  Head: Normocephalic and atraumatic.  Cardiovascular: Normal rate and regular rhythm.  Pulmonary/Chest: Effort normal and breath sounds normal.  Genitourinary:  Genitourinary Comments: Pelvic Exam: External: normal female  genitalia without lesions or masses Vagina: normal without lesions or masses Wet prep obtained.    Neurological: She is alert and oriented to person, place, and time.  Psychiatric: She has a normal mood and affect. Her behavior is normal.  Nursing note and vitals reviewed.  UC Treatments / Results  Labs (all labs ordered are listed, but only abnormal results are displayed) Labs Reviewed  WET PREP, GENITAL - Abnormal; Notable for the following components:      Result Value   Clue Cells Wet Prep HPF POC PRESENT (*)    WBC, Wet Prep HPF POC FEW (*)    All other components within normal limits    EKG None  Radiology No results found.  Procedures Procedures (including critical care time)  Medications Ordered in UC Medications - No data to display  Initial Impression / Assessment and Plan / UC Course  I have reviewed the triage vital signs and the nursing notes.  Pertinent labs & imaging results that were available during my care of the patient were reviewed by me and considered in my medical decision making (see chart  for details).    42 year old female presents with bacterial vaginosis.  Seen on wet prep.  Patient requesting Diflucan as well as antibiotic often causes yeast infection.  Treated with Flagyl and Diflucan.  Final Clinical Impressions(s) / UC Diagnoses   Final diagnoses:  Bacterial vaginosis   Discharge Instructions   None    ED Prescriptions    Medication Sig Dispense Auth. Provider   metroNIDAZOLE (FLAGYL) 500 MG tablet Take 1 tablet (500 mg total) by mouth 2 (two) times daily. 14 tablet Bobbye Petti G, DO   fluconazole (DIFLUCAN) 150 MG tablet Take 1 tablet (150 mg total) by mouth once for 1 dose. Repeat dose in 72 hours. 2 tablet Tommie Sams, DO     Controlled Substance Prescriptions [place] Controlled Substance Registry consulted? Not Applicable   Tommie Sams, DO 12/03/17 1522

## 2017-12-03 NOTE — ED Triage Notes (Signed)
Patient complains of vaginal itching. Patient states that she has a history of BV but hasn't noticed the discharge as much.

## 2017-12-09 DIAGNOSIS — Z9884 Bariatric surgery status: Secondary | ICD-10-CM | POA: Insufficient documentation

## 2018-03-19 ENCOUNTER — Other Ambulatory Visit: Payer: Self-pay

## 2018-03-19 ENCOUNTER — Ambulatory Visit
Admission: EM | Admit: 2018-03-19 | Discharge: 2018-03-19 | Disposition: A | Discharge status: Home / Self Care | Payer: Commercial Managed Care - PPO | Attending: Emergency Medicine | Admitting: Emergency Medicine

## 2018-03-19 DIAGNOSIS — N760 Acute vaginitis: Secondary | ICD-10-CM | POA: Diagnosis not present

## 2018-03-19 DIAGNOSIS — B9689 Other specified bacterial agents as the cause of diseases classified elsewhere: Secondary | ICD-10-CM

## 2018-03-19 LAB — WET PREP, GENITAL
SPERM: NONE SEEN
TRICH WET PREP: NONE SEEN
YEAST WET PREP: NONE SEEN

## 2018-03-19 LAB — CHLAMYDIA/NGC RT PCR (ARMC ONLY)
Chlamydia Tr: NOT DETECTED
N GONORRHOEAE: NOT DETECTED

## 2018-03-19 MED ORDER — METRONIDAZOLE 500 MG PO TABS: 500.0000 mg | ORAL_TABLET | Freq: Two times a day (BID) | ORAL | 0 refills | Status: DC

## 2018-03-19 MED ORDER — METRONIDAZOLE 500 MG PO TABS: 500.0000 mg | ORAL_TABLET | Freq: Two times a day (BID) | ORAL | 0 refills | Status: AC

## 2018-03-19 MED ORDER — FLUCONAZOLE 150 MG PO TABS: 150.0000 mg | ORAL_TABLET | Freq: Once | ORAL | 1 refills | Status: AC

## 2018-03-19 NOTE — ED Triage Notes (Signed)
Patient complains of BV. States that she has had this off and on last 20 years. Patient states that she has been noticing and an odor and smell.

## 2018-03-19 NOTE — Discharge Instructions (Addendum)
Take the medication as written. Give us a working phone number so that we can contact you if needed. Refrain from sexual contact until you know your results and your partner(s) are treated if necessary.   Here is a list of primary care providers who are taking new patients:  Dr. Elizabeth Sauereanna Jones, Dr. Schuyler AmorWilliam Plonk [address] [place] [address] [phone]  [facility] [place] [address]  [place] [address]  [phone]  [facility] [address]  [place], [place] [address] [phone]  [facility] [place] [address]  [phone] [place], [place] [address]  Here are clinics/ other resources who will see you if you do not have insurance. Some have certain criteria that you must meet. Call them and find out what they are:  [facility]: [address] [place] [address] [place], [place] [address] Phone: [phone] Hours: First and Third Saturdays of each Month, 9 a.m. - 1 p.m.  [facility]: [address], [place], [place] [address] Phone: [phone] Hours: Tuesday, 4 p.m. - 8 p.m. Thursday, 1 p.m. - 8 p.m. Wednesday, 9 a.m. - [facility]  [facility] [address], [place], [place] [address] Phone: [phone] Pharmacy Phone Number: [phone] Dental Phone Number: [phone] [facility] Insurance Help: [phone]  Dental Hours: Monday - Thursday, 8 a.m. - 6 p.m.  [facility] [address] [place], [place] [address] Phone: [phone] Pharmacy Phone Number: [phone] [facility] Insurance Help: [phone]  [facility] [address] [place], [place] [address] Phone: [phone] Pharmacy Phone Number: [phone] [facility] Insurance Help: [phone]  [facility] [address] [place], [place] [address] Phone: [phone] [facility] Insurance Help: [phone]   [facility] [address] [place], [place] [address] Phone:  [phone]  Go to www.goodrx.com to look up your medications. This will give you a list of where you can find your prescriptions at the most affordable prices. Or ask the pharmacist what the cash price is, or if they have any other discount programs available to help make your medication more affordable. This can be less expensive than what you would pay with insurance.

## 2018-03-19 NOTE — ED Provider Notes (Signed)
HPI  SUBJECTIVE:  Jasmine Simon is a 42 y.o. female who presents with 1 month of a vaginal odor. No dysuria, urgency, frequency, cloudy or oderous urine, hematuria,  genital blisters, vaginal itching. No aggravating, alleviating factors. Has tried cleaning herself with water and a washcloth.  No fevers, N/V, abd pain, pelvic pain,  back pain. No recent abx use, perfumed soaps / bodywashes. Pt is in a longterm monogamous sexual relationship with a female  partner who is asxatic. STD's not a concern today. Similar sx before when had BV.  She gets BV frequently.  She has a remote history of gonorrhea, chlamydia, Trichomonas, yeast infection. No h/o syphilis, herpes, HIV.  She is a borderline diabetic.  No history of hypertension.  LMP: Status post hysterectomy.  PMD: None.    History reviewed. No pertinent past medical history.  Past Surgical History:  Procedure Laterality Date  . ABDOMINAL HYSTERECTOMY    . INCONTINENCE SURGERY    . REMOVAL OF URINARY SLING    . TUBAL LIGATION      Family History  Problem Relation Age of Onset  . Cancer Other   . Heart failure Mother   . Diabetes Mother   . Hypertension Mother   . Hypertension Father   . Alcohol abuse Father     Social History   Tobacco Use  . Smoking status: Never Smoker  . Smokeless tobacco: Never Used  Substance Use Topics  . Alcohol use: Yes    Comment: occasionally  . Drug use: No    No current facility-administered medications for this encounter.   Current Outpatient Medications:  .  Biotin 10 MG TABS, Take 10 mg by mouth daily., Disp: , Rfl:  .  calcium-vitamin D (OSCAL WITH D) 500-200 MG-UNIT tablet, Take 1 tablet by mouth daily., Disp: , Rfl:  .  fluticasone (FLONASE) 50 MCG/ACT nasal spray, Place 1 spray into both nostrils daily as needed for allergies or rhinitis. , Disp: , Rfl:  .  Multiple Vitamin (MULTI-VITAMINS) TABS, Take 1 tablet by mouth daily., Disp: , Rfl:  .  phentermine 37.5 MG capsule, Take 37.5  mg by mouth every morning., Disp: , Rfl:  .  ranitidine (ZANTAC) 150 MG capsule, Take 150 mg by mouth 2 (two) times daily., Disp: , Rfl:  .  fluconazole (DIFLUCAN) 150 MG tablet, Take 1 tablet (150 mg total) by mouth once for 1 dose. 1 tab po x 1. May repeat in 72 hours if no improvement, Disp: 2 tablet, Rfl: 1 .  metroNIDAZOLE (FLAGYL) 500 MG tablet, Take 1 tablet (500 mg total) by mouth 2 (two) times daily for 7 days., Disp: 14 tablet, Rfl: 0  Allergies  Allergen Reactions  . Rocephin [Ceftriaxone Sodium In Dextrose] Itching and Rash     ROS  As noted in HPI.   Physical Exam  BP (!) 133/96 (BP Location: Left Arm)   Pulse 75   Temp 98.9 F (37.2 C) (Oral)   Resp 18   Ht 5' (1.524 m)   Wt 77.6 kg   SpO2 100%   BMI 33.40 kg/m   Constitutional: Well developed, well nourished, no acute distress Eyes:  EOMI, conjunctiva normal bilaterally HENT: Normocephalic, atraumatic,mucus membranes moist Respiratory: Normal inspiratory effort Cardiovascular: Normal rate GI: nondistended soft, nontender. No suprapubic tenderness  back: No CVA tenderness GU: Deferred skin: No rash, skin intact Musculoskeletal: no deformities Neurologic: Alert & oriented x 3, no focal neuro deficits Psychiatric: Speech and behavior appropriate   ED Course  Medications - No data to display  Orders Placed This Encounter  Procedures  . Wet prep, genital    Standing Status:   Standing    Number of Occurrences:   1  . Chlamydia/NGC rt PCR    Standing Status:   Standing    Number of Occurrences:   1    Results for orders placed or performed during the hospital encounter of 03/19/18 (from the past 24 hour(s))  Wet prep, genital     Status: Abnormal   Collection Time: 03/19/18 12:55 PM  Result Value Ref Range   Yeast Wet Prep HPF POC NONE SEEN NONE SEEN   Trich, Wet Prep NONE SEEN NONE SEEN   Clue Cells Wet Prep HPF POC PRESENT (A) NONE SEEN   WBC, Wet Prep HPF POC FEW (A) NONE SEEN   Sperm NONE  SEEN    No results found.  ED Clinical Impression  BV (bacterial vaginosis)   ED Assessment/Plan  H&P most c/w  BV.  Wet prep positive for BV.  Negative for trichomoniasis, yeast.  Sent off GC/chlamydia.. Will not treat empirically now as I think the patient is extremely low risk for gonorrhea or chlamydia.  Will send home with flagyl, diflucan case she gets a yeast infection.. Advised pt to refrain from sexual contact until  he knows lab results, symptoms resolve, and partner(s) are treated if necessary. Pt provided working phone number. Follow-up with PMD of choice as needed. Discussed labs, MDM, plan and followup with patient. Pt agrees with plan.   Meds ordered this encounter  Medications  . DISCONTD: metroNIDAZOLE (FLAGYL) 500 MG tablet    Sig: Take 1 tablet (500 mg total) by mouth 2 (two) times daily for 7 days.    Dispense:  14 tablet    Refill:  0  . fluconazole (DIFLUCAN) 150 MG tablet    Sig: Take 1 tablet (150 mg total) by mouth once for 1 dose. 1 tab po x 1. May repeat in 72 hours if no improvement    Dispense:  2 tablet    Refill:  1  . metroNIDAZOLE (FLAGYL) 500 MG tablet    Sig: Take 1 tablet (500 mg total) by mouth 2 (two) times daily for 7 days.    Dispense:  14 tablet    Refill:  0    *This clinic note was created using Scientist, clinical (histocompatibility and immunogenetics)Dragon dictation software. Therefore, there may be occasional mistakes despite careful proofreading.  ?    Domenick GongMortenson, Charnel Giles, MD 03/19/18 1319

## 2018-07-14 ENCOUNTER — Ambulatory Visit
Admission: EM | Admit: 2018-07-14 | Discharge: 2018-07-14 | Disposition: A | Discharge status: Home / Self Care | Payer: Self-pay | Attending: Family Medicine | Admitting: Family Medicine

## 2018-07-14 ENCOUNTER — Encounter: Payer: Self-pay | Admitting: Emergency Medicine

## 2018-07-14 ENCOUNTER — Other Ambulatory Visit: Payer: Self-pay

## 2018-07-14 DIAGNOSIS — L509 Urticaria, unspecified: Secondary | ICD-10-CM | POA: Insufficient documentation

## 2018-07-14 MED ORDER — PREDNISONE 10 MG PO TABS: ORAL_TABLET | ORAL | 0 refills | Status: DC

## 2018-07-14 MED ORDER — FAMOTIDINE 20 MG PO TABS: 20.0000 mg | ORAL_TABLET | Freq: Two times a day (BID) | ORAL | 0 refills | Status: AC

## 2018-07-14 MED ORDER — HYDROXYZINE HCL 25 MG PO TABS: 25.0000 mg | ORAL_TABLET | Freq: Three times a day (TID) | ORAL | 0 refills | Status: DC | PRN

## 2018-07-14 MED ORDER — CETIRIZINE HCL 10 MG PO TABS: 10.0000 mg | ORAL_TABLET | Freq: Every day | ORAL | 0 refills | Status: AC

## 2018-07-14 NOTE — ED Triage Notes (Signed)
Patient in today c/o hives since Monday (07/12/18). Patient states it itches and is now painful.

## 2018-07-14 NOTE — ED Provider Notes (Signed)
[facility]   CSN: [id number] Arrival date & time: 07/14/18  1224  History   Chief Complaint Chief Complaint  Patient presents with  . Urticaria   HPI  42 year old female presents with rash.  Patient reports that her rash started on Monday.  Located predominantly on the arms and upper chest.  Red, raised, severely itchy.  She reports that she is now having pain associated with these areas.  She has taken Benadryl without resolution.  She only notes 1 new exposure.  She states that she recently tried on some new bras.  She states that she has started a new job but is unsure of any new contacts or exposures there.  No new foods.  Rash and associated symptoms are severe. No reports of tongue or lip swelling.  No shortness of breath.  No other associated symptoms.  Past Surgical History:  Procedure Laterality Date  . ABDOMINAL HYSTERECTOMY    . INCONTINENCE SURGERY    . REMOVAL OF URINARY SLING    . TUBAL LIGATION     OB History   None    Home Medications    Prior to Admission medications   Medication Sig Start Date End Date Taking? Authorizing Provider  cetirizine (ZYRTEC) 10 MG tablet Take 1 tablet (10 mg total) by mouth daily. 07/14/18   Tommie Sams, DO  famotidine (PEPCID) 20 MG tablet Take 1 tablet (20 mg total) by mouth 2 (two) times daily. 07/14/18   Tommie Sams, DO  hydrOXYzine (ATARAX/VISTARIL) 25 MG tablet Take 1 tablet (25 mg total) by mouth every 8 (eight) hours as needed. 07/14/18   Tommie Sams, DO  predniSONE (DELTASONE) 10 MG tablet 50 mg daily x 2 days, then 40 mg daily x 2 days, then 30 mg daily x 2 days, then 20 mg daily x 2 days, then 10 mg daily x 2 days. 07/14/18   Tommie Sams, DO   Family History Family History  Problem Relation Age of Onset  . Cancer Other   . Heart failure Mother   . Diabetes Mother   . Hypertension Mother   . Hypertension Father   . Alcohol abuse Father    Social History Social History   Tobacco Use  .  Smoking status: Never Smoker  . Smokeless tobacco: Never Used  Substance Use Topics  . Alcohol use: Yes    Comment: occasionally  . Drug use: No   Allergies   Rocephin [ceftriaxone sodium in dextrose]   Review of Systems Review of Systems  Constitutional: Negative.   Respiratory: Negative.   Skin: Positive for rash.   Physical Exam Triage Vital Signs ED Triage Vitals  Enc Vitals Group     BP 07/14/18 1237 139/87     Pulse Rate 07/14/18 1237 73     Resp 07/14/18 1237 16     Temp 07/14/18 1237 98.7 F (37.1 C)     Temp Source 07/14/18 1237 Oral     SpO2 07/14/18 1237 100 %     Weight 07/14/18 1238 187 lb (84.8 kg)     Height 07/14/18 1238 5' (1.524 m)     Head Circumference --      Peak Flow --      Pain Score 07/14/18 1237 2     Pain Loc --      Pain Edu? --      Excl. in [place]? --    Updated Vital Signs BP 139/87 (BP Location: Left  Arm)   Pulse 73   Temp 98.7 F (37.1 C) (Oral)   Resp 16   Ht 5' (1.524 m)   Wt 84.8 kg   SpO2 100%   BMI 36.52 kg/m   Visual Acuity Right Eye Distance:   Left Eye Distance:   Bilateral Distance:    Right Eye Near:   Left Eye Near:    Bilateral Near:     Physical Exam  Constitutional: She is oriented to person, place, and time. She appears well-developed. No distress.  HENT:  Head: Normocephalic and atraumatic.  Cardiovascular: Normal rate and regular rhythm.  Pulmonary/Chest: Effort normal and breath sounds normal. She has no wheezes. She has no rales.  Neurological: She is oriented to person, place, and time.  Skin:  Upper chest with raised, erythematous areas.  Similar lesions noted on the upper extremities.  Psychiatric: She has a normal mood and affect. Her behavior is normal.  Nursing note and vitals reviewed.  UC Treatments / Results  Labs (all labs ordered are listed, but only abnormal results are displayed) Labs Reviewed - No data to display  EKG None  Radiology No results found.  Procedures Procedures  (including critical care time)  Medications Ordered in UC Medications - No data to display  Initial Impression / Assessment and Plan / UC Course  I have reviewed the triage vital signs and the nursing notes.  Pertinent labs & imaging results that were available during my care of the patient were reviewed by me and considered in my medical decision making (see chart for details).    42 year old female presents with urticaria.  Treated with Zyrtec, Pepcid, prednisone.  Atarax as needed for severe itching.  Final Clinical Impressions(s) / UC Diagnoses   Final diagnoses:  Urticaria   Discharge Instructions   None    ED Prescriptions    Medication Sig Dispense Auth. Provider   cetirizine (ZYRTEC) 10 MG tablet Take 1 tablet (10 mg total) by mouth daily. 30 tablet Ruchy Wildrick G, DO   famotidine (PEPCID) 20 MG tablet Take 1 tablet (20 mg total) by mouth 2 (two) times daily. 60 tablet Shoshana Johal G, DO   hydrOXYzine (ATARAX/VISTARIL) 25 MG tablet Take 1 tablet (25 mg total) by mouth every 8 (eight) hours as needed. 30 tablet Mohini Heathcock G, DO   predniSONE (DELTASONE) 10 MG tablet 50 mg daily x 2 days, then 40 mg daily x 2 days, then 30 mg daily x 2 days, then 20 mg daily x 2 days, then 10 mg daily x 2 days. 30 tablet Tommie Samsook, Misa Fedorko G, DO     Controlled Substance Prescriptions [place] Controlled Substance Registry consulted? Not Applicable   Tommie SamsCook, Janai Maudlin G, DO 07/14/18 1328

## 2019-04-28 ENCOUNTER — Ambulatory Visit: Payer: Self-pay

## 2019-06-23 ENCOUNTER — Ambulatory Visit
Admission: EM | Admit: 2019-06-23 | Discharge: 2019-06-23 | Disposition: A | Discharge status: Home / Self Care | Payer: Self-pay | Attending: Family Medicine | Admitting: Family Medicine

## 2019-06-23 ENCOUNTER — Encounter: Payer: Self-pay | Admitting: Emergency Medicine

## 2019-06-23 ENCOUNTER — Other Ambulatory Visit: Payer: Self-pay

## 2019-06-23 DIAGNOSIS — Z113 Encounter for screening for infections with a predominantly sexual mode of transmission: Secondary | ICD-10-CM

## 2019-06-23 DIAGNOSIS — N760 Acute vaginitis: Secondary | ICD-10-CM

## 2019-06-23 DIAGNOSIS — B9689 Other specified bacterial agents as the cause of diseases classified elsewhere: Secondary | ICD-10-CM

## 2019-06-23 LAB — WET PREP, GENITAL
Sperm: NONE SEEN
Trich, Wet Prep: NONE SEEN
WBC, Wet Prep HPF POC: NONE SEEN
Yeast Wet Prep HPF POC: NONE SEEN

## 2019-06-23 MED ORDER — METRONIDAZOLE 500 MG PO TABS: 500.0000 mg | ORAL_TABLET | Freq: Two times a day (BID) | ORAL | 0 refills | Status: DC

## 2019-06-23 MED ORDER — FLUCONAZOLE 150 MG PO TABS: 150.0000 mg | ORAL_TABLET | Freq: Every day | ORAL | 0 refills | Status: DC

## 2019-06-23 NOTE — Discharge Instructions (Addendum)
Take medication as prescribed. Rest. Drink plenty of fluids.  ° °Follow up with your primary care physician this week as needed. Return to Urgent care for new or worsening concerns.  ° °

## 2019-06-23 NOTE — ED Triage Notes (Signed)
Patient c/o grey vaginal d/c and vaginal odor that started 1 month. Patient states she tried Boric Acid.

## 2019-06-23 NOTE — ED Provider Notes (Signed)
[facility] ____________________________________________  Time seen: Approximately 4:08 PM  I have reviewed the triage vital signs and the nursing notes.   HISTORY  Chief Complaint Vaginal Discharge   HPI Jasmine Simon is a 43 y.o. female presenting for evaluation of 1 week of intermittent vaginal odor, had 1 day of discharge that was whitish-grayish.  States it reminded her previous bacterial vaginosis so she utilized boric acid without resolution.  Denies dysuria, abdominal pain, back pain.  No fevers.  Reports continues eat and drink well.  Sexually active the same partner, requested gonorrhea and Chlamydia testing, states not highly concerned.  Denies other aggravating leaving factors reports otherwise doing well.  No LMP recorded. Patient has had a hysterectomy.    History reviewed. No pertinent past medical history.  There are no active problems to display for this patient.   Past Surgical History:  Procedure Laterality Date  . ABDOMINAL HYSTERECTOMY    . INCONTINENCE SURGERY    . REMOVAL OF URINARY SLING    . TUBAL LIGATION       No current facility-administered medications for this encounter.   Current Outpatient Medications:  .  cetirizine (ZYRTEC) 10 MG tablet, Take 1 tablet (10 mg total) by mouth daily., Disp: 30 tablet, Rfl: 0 .  famotidine (PEPCID) 20 MG tablet, Take 1 tablet (20 mg total) by mouth 2 (two) times daily., Disp: 60 tablet, Rfl: 0 .  fluconazole (DIFLUCAN) 150 MG tablet, Take 1 tablet (150 mg total) by mouth daily. Take one pill orally as needed, Disp: 1 tablet, Rfl: 0 .  metroNIDAZOLE (FLAGYL) 500 MG tablet, Take 1 tablet (500 mg total) by mouth 2 (two) times daily., Disp: 14 tablet, Rfl: 0  Allergies Rocephin [ceftriaxone sodium in dextrose]  Family History  Problem Relation Age of Onset  . Cancer Other   . Heart failure Mother   . Diabetes Mother   . Hypertension Mother   . Hypertension Father   . Alcohol abuse Father      Social History Social History   Tobacco Use  . Smoking status: Never Smoker  . Smokeless tobacco: Never Used  Substance Use Topics  . Alcohol use: Yes    Comment: occasionally  . Drug use: No    Review of Systems Constitutional: No fever ENT: No sore throat. Cardiovascular: Denies chest pain. Respiratory: Denies shortness of breath. Gastrointestinal: No abdominal pain.  No nausea, no vomiting.  No diarrhea.   Genitourinary: Negative for dysuria. As above.  Musculoskeletal: Negative for back pain. Skin: Negative for rash.  ____________________________________________   PHYSICAL EXAM:  VITAL SIGNS: ED Triage Vitals  Enc Vitals Group     BP 06/23/19 1502 132/81     Pulse Rate 06/23/19 1502 70     Resp 06/23/19 1502 18     Temp 06/23/19 1502 98.7 F (37.1 C)     Temp Source 06/23/19 1502 Oral     SpO2 06/23/19 1502 100 %     Weight 06/23/19 1500 185 lb (83.9 kg)     Height 06/23/19 1500 5' (1.524 m)     Head Circumference --      Peak Flow --      Pain Score 06/23/19 1500 0     Pain Loc --      Pain Edu? --      Excl. in [place]? --     Constitutional: Alert and oriented. Well appearing and in no acute distress. Eyes: Conjunctivae are normal.  ENT  Head: Normocephalic and atraumatic. Cardiovascular: Normal rate, regular rhythm. Grossly normal heart sounds.  Good peripheral circulation. Respiratory: Normal respiratory effort without tachypnea nor retractions. Breath sounds are clear and equal bilaterally. No wheezes, rales, rhonchi. Gastrointestinal: Soft and nontender. No CVA tenderness. Musculoskeletal:  Steady gait.  Neurologic:  Normal speech and language. Speech is normal. No gait instability.  Skin:  Skin is warm, dry and intact. No rash noted. Psychiatric: Mood and affect are normal. Speech and behavior are normal. Patient exhibits appropriate insight and judgment   ___________________________________________   LABS (all labs ordered are listed,  but only abnormal results are displayed)  Labs Reviewed  WET PREP, GENITAL - Abnormal; Notable for the following components:      Result Value   Clue Cells Wet Prep HPF POC PRESENT (*)    All other components within normal limits  GC/CHLAMYDIA PROBE AMP     PROCEDURES Procedures    INITIAL IMPRESSION / ASSESSMENT AND PLAN / ED COURSE  Pertinent labs & imaging results that were available during my care of the patient were reviewed by me and considered in my medical decision making (see chart for details).  Well-appearing patient.  No acute distress.  History of bacterial vaginosis similar presentation, patient elected for self wet prep and self gonorrhea chlamydia, wet prep positive for clue cells.  Will treat with oral Flagyl.  Encourage rest, fluids, pelvic rest, supportive care.Discussed indication, risks and benefits of medications with patient.   Discussed follow up with Primary care physician this week. Discussed follow up and return parameters including no resolution or any worsening concerns. Patient verbalized understanding and agreed to plan.   ____________________________________________   FINAL CLINICAL IMPRESSION(S) / ED DIAGNOSES  Final diagnoses:  BV (bacterial vaginosis)  Screen for STD (sexually transmitted disease)     ED Discharge Orders         Ordered    metroNIDAZOLE (FLAGYL) 500 MG tablet  2 times daily     06/23/19 1529    fluconazole (DIFLUCAN) 150 MG tablet  Daily     06/23/19 1529           Note: This dictation was prepared with Dragon dictation along with smaller phrase technology. Any transcriptional errors that result from this process are unintentional.         Renford Dills, NP 06/23/19 1612

## 2019-06-27 LAB — GC/CHLAMYDIA PROBE AMP
Chlamydia trachomatis, NAA: NEGATIVE
Neisseria Gonorrhoeae by PCR: NEGATIVE

## 2019-08-25 ENCOUNTER — Ambulatory Visit
Admission: EM | Admit: 2019-08-25 | Discharge: 2019-08-25 | Disposition: A | Discharge status: Home / Self Care | Payer: Self-pay | Attending: Emergency Medicine | Admitting: Emergency Medicine

## 2019-08-25 ENCOUNTER — Other Ambulatory Visit: Payer: Self-pay

## 2019-08-25 DIAGNOSIS — N760 Acute vaginitis: Secondary | ICD-10-CM | POA: Insufficient documentation

## 2019-08-25 DIAGNOSIS — Z113 Encounter for screening for infections with a predominantly sexual mode of transmission: Secondary | ICD-10-CM | POA: Insufficient documentation

## 2019-08-25 DIAGNOSIS — B9689 Other specified bacterial agents as the cause of diseases classified elsewhere: Secondary | ICD-10-CM | POA: Insufficient documentation

## 2019-08-25 LAB — WET PREP, GENITAL
Sperm: NONE SEEN
Trich, Wet Prep: NONE SEEN
WBC, Wet Prep HPF POC: NONE SEEN
Yeast Wet Prep HPF POC: NONE SEEN

## 2019-08-25 MED ORDER — FLUCONAZOLE 150 MG PO TABS: 150.0000 mg | ORAL_TABLET | Freq: Every day | ORAL | 0 refills | Status: DC

## 2019-08-25 MED ORDER — METRONIDAZOLE 500 MG PO TABS: 500.0000 mg | ORAL_TABLET | Freq: Two times a day (BID) | ORAL | 0 refills | Status: DC

## 2019-08-25 NOTE — ED Provider Notes (Signed)
[facility] ____________________________________________  Time seen: Approximately 2:10 PM  I have reviewed the triage vital signs and the nursing notes.   HISTORY  Chief Complaint Vaginitis   HPI Jasmine Simon is a 44 y.o. female presenting for evaluation of 1 week of vaginal odor.  Denies discharge, pain or sores.  Denies accompanying abdominal pain, dysuria or back pain.  Continues eat and drink well.  Reports otherwise doing well.  Requested gonorrhea and Chlamydia testing completed for screening, states not highly concerned of STDs.  Denies pregnancy.  Reports otherwise doing well.  Denies aggravating or alleviating factors.  States feels consistent with previous bacterial vaginosis infections.   History reviewed. No pertinent past medical history.  There are no problems to display for this patient.   Past Surgical History:  Procedure Laterality Date  . ABDOMINAL HYSTERECTOMY    . INCONTINENCE SURGERY    . REMOVAL OF URINARY SLING    . TUBAL LIGATION       No current facility-administered medications for this encounter.  Current Outpatient Medications:  .  cetirizine (ZYRTEC) 10 MG tablet, Take 1 tablet (10 mg total) by mouth daily., Disp: 30 tablet, Rfl: 0 .  famotidine (PEPCID) 20 MG tablet, Take 1 tablet (20 mg total) by mouth 2 (two) times daily., Disp: 60 tablet, Rfl: 0 .  fluconazole (DIFLUCAN) 150 MG tablet, Take 1 tablet (150 mg total) by mouth daily. Take one pill orally as needed, Disp: 1 tablet, Rfl: 0 .  metroNIDAZOLE (FLAGYL) 500 MG tablet, Take 1 tablet (500 mg total) by mouth 2 (two) times daily., Disp: 14 tablet, Rfl: 0  Allergies Rocephin [ceftriaxone sodium in dextrose]  Family History  Problem Relation Age of Onset  . Cancer Other   . Heart failure Mother   . Diabetes Mother   . Hypertension Mother   . Hypertension Father   . Alcohol abuse Father     Social History Social History   Tobacco Use  . Smoking status: Never  Smoker  . Smokeless tobacco: Never Used  Substance Use Topics  . Alcohol use: Yes    Comment: occasionally  . Drug use: No    Review of Systems Constitutional: No fever ENT: No sore throat. Cardiovascular: Denies chest pain. Respiratory: Denies shortness of breath. Gastrointestinal: No abdominal pain.  No nausea, no vomiting.  No diarrhea.   Genitourinary: Negative for dysuria.  As above. Musculoskeletal: Negative for back pain. Skin: Negative for rash.   ____________________________________________   PHYSICAL EXAM:  VITAL SIGNS: ED Triage Vitals  Enc Vitals Group     BP 08/25/19 1337 (!) 156/97     Pulse Rate 08/25/19 1337 72     Resp --      Temp 08/25/19 1337 98.2 F (36.8 C)     Temp Source 08/25/19 1337 Oral     SpO2 08/25/19 1337 100 %     Weight 08/25/19 1336 179 lb (81.2 kg)     Height 08/25/19 1336 5' (1.524 m)     Head Circumference --      Peak Flow --      Pain Score 08/25/19 1335 0     Pain Loc --      Pain Edu? --      Excl. in [place]? --     Constitutional: Alert and oriented. Well appearing and in no acute distress. Eyes: Conjunctivae are normal.  ENT      Head: Normocephalic and atraumatic. Cardiovascular: Normal rate, regular rhythm. Grossly normal  heart sounds.  Good peripheral circulation. Respiratory: Normal respiratory effort without tachypnea nor retractions. Breath sounds are clear and equal bilaterally. No wheezes, rales, rhonchi. Gastrointestinal:No CVA tenderness. Musculoskeletal: Steady gait Neurologic:  Normal speech and language.Speech is normal. No gait instability.  Skin:  Skin is warm, dry and intact. No rash noted. Psychiatric: Mood and affect are normal. Speech and behavior are normal. Patient exhibits appropriate insight and judgment   ___________________________________________   LABS (all labs ordered are listed, but only abnormal results are displayed)  Labs Reviewed  WET PREP, GENITAL - Abnormal; Notable for the  following components:      Result Value   Clue Cells Wet Prep HPF POC PRESENT (*)    All other components within normal limits  GC/CHLAMYDIA PROBE AMP   ____________________________________________   PROCEDURES Procedures    INITIAL IMPRESSION / ASSESSMENT AND PLAN / ED COURSE  Pertinent labs & imaging results that were available during my care of the patient were reviewed by me and considered in my medical decision making (see chart for details).  Well-appearing patient.  No acute distress.  Patient elected for self wet prep and self GC chlamydia swab.  Wet prep positive for clue cells.  Will treat with oral Flagyl.  Request Rx Diflucan, Rx given.  Encourage supportive care.  Discussed over-the-counter use of probiotics. Discussed indication, risks and benefits of medications with patient.  Discussed follow up and return parameters including no resolution or any worsening concerns. Patient verbalized understanding and agreed to plan.   ____________________________________________   FINAL CLINICAL IMPRESSION(S) / ED DIAGNOSES  Final diagnoses:  Bacterial vaginosis  Screen for STD (sexually transmitted disease)     ED Discharge Orders         Ordered    metroNIDAZOLE (FLAGYL) 500 MG tablet  2 times daily     08/25/19 1406    fluconazole (DIFLUCAN) 150 MG tablet  Daily     08/25/19 1406           Note: This dictation was prepared with Dragon dictation along with smaller phrase technology. Any transcriptional errors that result from this process are unintentional.         Renford Dills, NP 08/25/19 1412

## 2019-08-25 NOTE — ED Triage Notes (Signed)
Pt presents with c/o mild vaginal odor. She denies any other symptoms such as itching, burning or urinary symptoms. She has been having unprotected sexual relations with a single partner. She states she has chronic BV.

## 2019-08-25 NOTE — Discharge Instructions (Addendum)
Take medication as prescribed. Rest. Drink plenty of fluids.  ° °Follow up with your primary care physician this week as needed. Return to Urgent care for new or worsening concerns.  ° °

## 2019-08-27 LAB — GC/CHLAMYDIA PROBE AMP
Chlamydia trachomatis, NAA: NEGATIVE
Neisseria Gonorrhoeae by PCR: NEGATIVE

## 2019-12-12 ENCOUNTER — Other Ambulatory Visit: Payer: Self-pay

## 2019-12-12 ENCOUNTER — Ambulatory Visit
Admission: EM | Admit: 2019-12-12 | Discharge: 2019-12-12 | Disposition: A | Discharge status: Home / Self Care | Payer: Self-pay | Attending: Family Medicine | Admitting: Family Medicine

## 2019-12-12 ENCOUNTER — Encounter: Payer: Self-pay | Admitting: Emergency Medicine

## 2019-12-12 DIAGNOSIS — B9689 Other specified bacterial agents as the cause of diseases classified elsewhere: Secondary | ICD-10-CM | POA: Insufficient documentation

## 2019-12-12 DIAGNOSIS — N760 Acute vaginitis: Secondary | ICD-10-CM | POA: Insufficient documentation

## 2019-12-12 HISTORY — DX: Other specified bacterial agents as the cause of diseases classified elsewhere: N76.0

## 2019-12-12 HISTORY — DX: Other specified bacterial agents as the cause of diseases classified elsewhere: B96.89

## 2019-12-12 LAB — WET PREP, GENITAL
Sperm: NONE SEEN
Trich, Wet Prep: NONE SEEN
WBC, Wet Prep HPF POC: NONE SEEN
Yeast Wet Prep HPF POC: NONE SEEN

## 2019-12-12 MED ORDER — FLUCONAZOLE 150 MG PO TABS
150.0000 mg | ORAL_TABLET | Freq: Once | ORAL | 0 refills | Status: AC
Start: 2019-12-12 — End: 2019-12-12

## 2019-12-12 MED ORDER — CLINDAMYCIN HCL 300 MG PO CAPS
300.0000 mg | ORAL_CAPSULE | Freq: Two times a day (BID) | ORAL | 0 refills | Status: AC
Start: 2019-12-12 — End: 2019-12-19

## 2019-12-12 NOTE — ED Provider Notes (Signed)
[facility] ____________________________________________  Time seen: Approximately 7:00 PM  I have reviewed the triage vital signs and the nursing notes.   HISTORY  Chief Complaint vaginal odor   HPI Jasmine Simon is a 44 y.o. female past medical history of recurrent BV presenting for evaluation of vaginal odor.  States is been ongoing for about 2 to 3 weeks.  States is consistent with her normal bacterial vaginosis.  Denies concerns of STDs.  Denies vaginal discharge, dysuria, vaginal pain, abdominal pain, back pain, vomiting, fevers or other recent sickness.  Denies recent antibiotic use.  Patient states she is normally treated with Flagyl, request to have clindamycin orally.  Denies other aggravating alleviating factors.  Gauger, Victoriano Lain, NP : PCP  Past Medical History:  Diagnosis Date  . BV (bacterial vaginosis)     There are no problems to display for this patient.   Past Surgical History:  Procedure Laterality Date  . ABDOMINAL HYSTERECTOMY    . INCONTINENCE SURGERY    . REMOVAL OF URINARY SLING    . TUBAL LIGATION       No current facility-administered medications for this encounter.  Current Outpatient Medications:  .  cetirizine (ZYRTEC) 10 MG tablet, Take 1 tablet (10 mg total) by mouth daily., Disp: 30 tablet, Rfl: 0 .  famotidine (PEPCID) 20 MG tablet, Take 1 tablet (20 mg total) by mouth 2 (two) times daily., Disp: 60 tablet, Rfl: 0 .  clindamycin (CLEOCIN) 300 MG capsule, Take 1 capsule (300 mg total) by mouth in the morning and at bedtime for 7 days., Disp: 14 capsule, Rfl: 0 .  fluconazole (DIFLUCAN) 150 MG tablet, Take 1 tablet (150 mg total) by mouth once for 1 dose. in one week as needed., Disp: 1 tablet, Rfl: 0  Allergies Rocephin [ceftriaxone sodium in dextrose]  Family History  Problem Relation Age of Onset  . Cancer Other   . Heart failure Mother   . Diabetes Mother   . Hypertension Mother   . Hypertension Father   .  Alcohol abuse Father     Social History Social History   Tobacco Use  . Smoking status: Never Smoker  . Smokeless tobacco: Never Used  Substance Use Topics  . Alcohol use: Yes    Comment: occasionally  . Drug use: No    Review of Systems Constitutional: No fever Cardiovascular: Denies chest pain. Respiratory: Denies shortness of breath. Gastrointestinal: No abdominal pain.  No nausea, no vomiting.  No diarrhea.  Genitourinary: Negative for dysuria.  As above Musculoskeletal: Negative for back pain. Skin: Negative for rash.   ____________________________________________   PHYSICAL EXAM:  VITAL SIGNS: ED Triage Vitals  Enc Vitals Group     BP 12/12/19 1758 (!) 145/97     Pulse Rate 12/12/19 1758 67     Resp 12/12/19 1758 18     Temp 12/12/19 1758 98.2 F (36.8 C)     Temp Source 12/12/19 1758 Oral     SpO2 12/12/19 1758 100 %     Weight 12/12/19 1755 179 lb 0.2 oz (81.2 kg)     Height 12/12/19 1755 5' (1.524 m)     Head Circumference --      Peak Flow --      Pain Score 12/12/19 1755 0     Pain Loc --      Pain Edu? --      Excl. in [place]? --     Constitutional: Alert and oriented. Well appearing and in  no acute distress. ENT      Head: Normocephalic and atraumatic. Respiratory: Normal respiratory effort without tachypnea nor retractions.  Gastrointestinal: Soft and nontender. No CVA tenderness. Musculoskeletal: Steady gait Neurologic:  Normal speech and language. Speech is normal. No gait instability.  Skin:  Skin is warm, dry and intact. No rash noted. Psychiatric: Mood and affect are normal. Speech and behavior are normal. Patient exhibits appropriate insight and judgment   ___________________________________________   LABS (all labs ordered are listed, but only abnormal results are displayed)  Labs Reviewed  WET PREP, GENITAL - Abnormal; Notable for the following components:      Result Value   Clue Cells Wet Prep HPF POC PRESENT (*)    All other  components within normal limits   ____________________________________________  PROCEDURES Procedures    INITIAL IMPRESSION / ASSESSMENT AND PLAN / ED COURSE  Pertinent labs & imaging results that were available during my care of the patient were reviewed by me and considered in my medical decision making (see chart for details).  Well-appearing patient.  Patient active for self wet prep.  Wet prep positive for clue cells.  Discussed treatment options with patient.  Patient requests oral clindamycin, counseled regarding his antibiotic.  No recent antibiotic use.  Will treat with oral clindamycin, Diflucan given as needed and recommend probiotics.  Discussed very strict follow-up and return parameters.  Follow-up with primary OB/GYN.  Discussed indication, risks and benefits of medications with patient. Discussed follow up and return parameters including no resolution or any worsening concerns. Patient verbalized understanding and agreed to plan.   ____________________________________________   FINAL CLINICAL IMPRESSION(S) / ED DIAGNOSES  Final diagnoses:  Bacterial vaginosis     ED Discharge Orders         Ordered    clindamycin (CLEOCIN) 300 MG capsule  2 times daily     12/12/19 1838    fluconazole (DIFLUCAN) 150 MG tablet   Once     12/12/19 1838           Note: This dictation was prepared with Dragon dictation along with smaller phrase technology. Any transcriptional errors that result from this process are unintentional.         Renford Dills, NP 12/12/19 1903

## 2019-12-12 NOTE — ED Triage Notes (Signed)
Pt c/i vaginal odor. Started about 2 weeks ago. Denies discharge. No concern for STDs.

## 2019-12-12 NOTE — Discharge Instructions (Addendum)
Take medication as prescribed. Rest. Drink plenty of fluids. Over the counter probiotics.   Follow up with your primary care physician this week as needed. Return to Urgent care for new or worsening concerns.

## 2022-02-05 ENCOUNTER — Other Ambulatory Visit (HOSPITAL_COMMUNITY): Payer: Self-pay | Admitting: Family Medicine

## 2022-02-05 ENCOUNTER — Ambulatory Visit (HOSPITAL_COMMUNITY)
Admission: RE | Admit: 2022-02-05 | Discharge: 2022-02-05 | Disposition: A | Discharge status: Home / Self Care | Payer: Self-pay | Source: Ambulatory Visit | Attending: Family Medicine | Admitting: Family Medicine

## 2022-02-05 DIAGNOSIS — R7611 Nonspecific reaction to tuberculin skin test without active tuberculosis: Secondary | ICD-10-CM

## 2022-02-17 ENCOUNTER — Telehealth: Payer: Self-pay

## 2022-02-17 ENCOUNTER — Other Ambulatory Visit: Payer: Self-pay | Admitting: Surgery

## 2022-02-17 ENCOUNTER — Ambulatory Visit (LOCAL_COMMUNITY_HEALTH_CENTER): Payer: Self-pay

## 2022-02-17 VITALS — Ht 61.0 in | Wt 190.0 lb

## 2022-02-17 DIAGNOSIS — R7612 Nonspecific reaction to cell mediated immunity measurement of gamma interferon antigen response without active tuberculosis: Secondary | ICD-10-CM | POA: Insufficient documentation

## 2022-02-17 NOTE — Progress Notes (Signed)
Tuberculosis treatment orders  All patients are to be monitored per [place] and county TB policies.   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 Treat for latent TB per the following:  Rifampin 600mg  daily by mouth x 4 months, draw LFTs monthly per Dr.  Please draw CBC w/ diff, LFTs, RPR/HIV at TB start visit.   +QFT 01/23/22 & 01/29/22 CXR without Active TB 02/05/22 HIV- will do at start appt. EPI 02/17/22

## 2022-02-17 NOTE — Progress Notes (Signed)
EPI completed via telephone.Patient recently moved to [facility] from [place].Worked in a LTC facility with "sick patients".Does not have her own PCP yet. Discussed positive QFT and normal chest x-ray. 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 Husband recently dx with LTBI and treated.Patient states it is so coincidental her  husband also had LTBI. They are not sure where they were exposed, possibly "company". Patient reports last PPD negative in 2017.                                                   Patient would like to proceed with LTBI tx, but reports difficulty swallowing pills. Explained TB med can be added to food to take.Patient agreed to treatment. Reviewed Rifampin treatment regimen.  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 Currently, a new employee at [employer]. Patient did report having a noticeable lymph node in right groin a couple of months ago which is no longer palpable. Scheduled visit to start TB therapy on Friday, 02/28/2022 at 4:00 PM. Instructed to arrive 15 minutes prior to visit for check-in. Augustin Schooling, RN  Richmond Campbell, RN reviewed this encounter before signing.

## 2022-02-20 NOTE — Telephone Encounter (Signed)
See Epi 02/17/22. Augustin Schooling, RN

## 2022-02-28 ENCOUNTER — Ambulatory Visit (LOCAL_COMMUNITY_HEALTH_CENTER): Payer: Self-pay

## 2022-02-28 VITALS — Wt 201.0 lb

## 2022-02-28 DIAGNOSIS — R7612 Nonspecific reaction to cell mediated immunity measurement of gamma interferon antigen response without active tuberculosis: Secondary | ICD-10-CM

## 2022-02-28 NOTE — Progress Notes (Signed)
In Nurse Clinic for LTBI / TB Med Start / Rifampin #1  Started Saxenda last week for wt loss.  Reports hx random generalized itching that she attributes to stress/anxiety. Last flare up 2 months ago.  Last alcohol 2 weeks ago. Counseled regarding no alcohol while on Rifampin and pt states that will not be a problem.  Declines HIV and RPR today. HIV testing declination signed. Pt states she had negative HIV test 2021 while in [place].  LTBI consent signed. Latent vs Active TB sheet reviewed and given.  Rifampin info sheet reviewed and given. TB coord card given.  RN walked pt to lab for blood draw. CBC and LFT's ordered by Dr Levonne Hubert.    After lab, The patient was dispensed Rifampin 300 mg #60 (Bottle #1) today per order Dr Levonne Hubert.  I provided counseling today regarding the medication. We discussed the medication, the side effects and when to call clinic. Patient given the opportunity to ask questions. Questions answered.     After pt visit, Phone call to RN from [facility] in lab who says unable to get blood after 2 sticks. Ines Bloomer explains that pt said she was dehydrated. Lab counseled pt to hydrate over weekend and return Monday 03/03/22 for blood draw. Pt did not mention to RN that lab was unable to complete blood draw.   RN notified Dr Levonne Hubert per Epic message about blood draw.   Next TB appt scheduled 03/27/2022 at 4 pm. Reminder given. Jerel Shepherd, RN

## 2022-03-03 ENCOUNTER — Telehealth: Payer: Self-pay | Admitting: Surgery

## 2022-03-03 DIAGNOSIS — R7612 Nonspecific reaction to cell mediated immunity measurement of gamma interferon antigen response without active tuberculosis: Secondary | ICD-10-CM

## 2022-03-03 NOTE — Telephone Encounter (Signed)
Spoke w patient, doing well, taking meds, opening capsules and putting into food. She's reports she is doing better, will come in tomorrow for labs (CBC and LFT) and will be at her next appt in approximately 4 weeks.  Jennye Moccasin, MD

## 2022-03-04 ENCOUNTER — Other Ambulatory Visit: Payer: Self-pay

## 2022-03-05 ENCOUNTER — Other Ambulatory Visit: Payer: Self-pay

## 2022-03-28 ENCOUNTER — Telehealth: Payer: Self-pay

## 2022-03-28 NOTE — Telephone Encounter (Signed)
Missed appointment scheduled on 03/27/22 for bottle #2 of Rifampin and blood work. Call to patient to reschedule. No answer. Left message with number to call back.  Augustin Schooling, RN

## 2022-04-04 ENCOUNTER — Ambulatory Visit (LOCAL_COMMUNITY_HEALTH_CENTER): Payer: Self-pay | Admitting: Surgery

## 2022-04-04 VITALS — Wt 196.5 lb

## 2022-04-04 DIAGNOSIS — R7612 Nonspecific reaction to cell mediated immunity measurement of gamma interferon antigen response without active tuberculosis: Secondary | ICD-10-CM

## 2022-04-04 MED ORDER — RIFAMPIN 300 MG PO CAPS: 600.0000 mg | ORAL_CAPSULE | Freq: Every day | ORAL | 0 refills | Status: AC

## 2022-04-04 NOTE — Progress Notes (Signed)
Patient has been taking 600 mg daily for 1 month for LTBI treatment.  Tried to get labs last visit but phlebotomy could not draw. Patient missed two follow up lab visits. Started today's appt with blood draw for CBC and LFTs. She is a difficult stick but draw was successful today.  Patient is doing well on current medication regimen. No n/v/f/c, eating normally, no concerning weight loss. Patient reports taking medications daily as prescribed, she stopped opening capsules due to difficulty swallowing pills. She is now able to swallow them as is.  Patient has no pills left, will start on the next bottle of medication.  Dispensed #2 month of Rifampin for LTBI tx. Dispensed #60 300 mg capsules.   I provided counseling today regarding the medication, we discussed the medication, the side effects and when to call clinic. Patient given the opportunity to ask questions. Patient is still angry she was exposed by someone who was sick and did not tell her, sympathized with her situation.  Patient advised to contact [facility]/TB control phone for any concerning symptoms or questions.  Patient's next visit has been scheduled for:  September 29th 4pm  Contact patient 1 week ahead of time to confirm appointment and reschedule if necessary.  Jennye Moccasin, MD

## 2022-04-05 LAB — CBC WITH DIFFERENTIAL/PLATELET
Basophils Absolute: 0 10*3/uL (ref 0.0–0.2)
Basos: 1 %
EOS (ABSOLUTE): 0.2 10*3/uL (ref 0.0–0.4)
Eos: 2 %
Hematocrit: 39.7 % (ref 34.0–46.6)
Hemoglobin: 13.1 g/dL (ref 11.1–15.9)
Immature Grans (Abs): 0 10*3/uL (ref 0.0–0.1)
Immature Granulocytes: 0 %
Lymphocytes Absolute: 3 10*3/uL (ref 0.7–3.1)
Lymphs: 40 %
MCH: 26.8 pg (ref 26.6–33.0)
MCHC: 33 g/dL (ref 31.5–35.7)
MCV: 81 fL (ref 79–97)
Monocytes Absolute: 0.6 10*3/uL (ref 0.1–0.9)
Monocytes: 8 %
Neutrophils Absolute: 3.7 10*3/uL (ref 1.4–7.0)
Neutrophils: 49 %
Platelets: 206 10*3/uL (ref 150–450)
RBC: 4.89 x10E6/uL (ref 3.77–5.28)
RDW: 13.6 % (ref 11.7–15.4)
WBC: 7.5 10*3/uL (ref 3.4–10.8)

## 2022-04-05 LAB — HEPATIC FUNCTION PANEL
ALT: 12 IU/L (ref 0–32)
AST: 16 IU/L (ref 0–40)
Albumin: 4.2 g/dL (ref 3.9–4.9)
Alkaline Phosphatase: 72 IU/L (ref 44–121)
Bilirubin Total: 0.2 mg/dL (ref 0.0–1.2)
Bilirubin, Direct: <0.1 mg/dL (ref 0.00–0.40)
Total Protein: 7 g/dL (ref 6.0–8.5)

## 2022-04-14 NOTE — Addendum Note (Signed)
Addended by: Heywood Bene on: 04/14/2022 10:02 AM   Modules accepted: Orders

## 2022-05-05 ENCOUNTER — Telehealth: Payer: Self-pay | Admitting: Surgery

## 2022-05-05 DIAGNOSIS — R7612 Nonspecific reaction to cell mediated immunity measurement of gamma interferon antigen response without active tuberculosis: Secondary | ICD-10-CM

## 2022-05-05 NOTE — Telephone Encounter (Signed)
No answer, left message about missed appt Fri 9/29 at 4pm for her 3rd bottle of RIfampin for LTBI tx. Asked her to contact us to reschedule, sent same message by text message.   Leigh Aurora, MD

## 2022-05-15 ENCOUNTER — Ambulatory Visit (LOCAL_COMMUNITY_HEALTH_CENTER): Payer: Self-pay

## 2022-05-15 VITALS — Wt 194.5 lb

## 2022-05-15 DIAGNOSIS — R7612 Nonspecific reaction to cell mediated immunity measurement of gamma interferon antigen response without active tuberculosis: Secondary | ICD-10-CM

## 2022-05-15 MED ORDER — RIFAMPIN 300 MG PO CAPS: 600.0000 mg | ORAL_CAPSULE | Freq: Every day | ORAL | 0 refills | Status: AC

## 2022-05-15 NOTE — Progress Notes (Signed)
Patient in clinic for LTBI treatment Rifampin #3. Patient without complaints. Reports she completed all the pills in her previous bottle. Explained to patient this is her 3rd bottle and she has one more after this one.   Explained the side effects for the medication. Patient did not want a drug information sheet. No questions at this time. Dispensed #60 of Rifampin 300 mg per Dr Ernestina Patches SO. Next appointment scheduled for November 9th at 11:15 AM. Servando Salina, RN

## 2022-06-12 ENCOUNTER — Telehealth: Payer: Self-pay

## 2022-06-12 NOTE — Telephone Encounter (Signed)
Sent text to contact [facility] and reschedule appointment for Rifampin #4. Augustin Schooling, RN

## 2022-06-16 NOTE — Telephone Encounter (Signed)
Call to patient to reschedule missed Rifampin #4 appointment. Patient is in meeting at the moment. She will call the TB nurse back in a couple of hours when she is out of the meeting and has her schedule with her. Augustin Schooling, RN

## 2022-06-17 ENCOUNTER — Telehealth: Payer: Self-pay

## 2022-06-17 NOTE — Telephone Encounter (Signed)
Call to patient since no return call yesterday. LMOM with call back number to reschedule Rifampin #4 appointment.  Augustin Schooling, RN

## 2022-06-18 ENCOUNTER — Telehealth: Payer: Self-pay

## 2022-06-18 NOTE — Telephone Encounter (Signed)
Patient sent text stating she is available for Rifampin #4 visit on 06/23/22. RN sent text and confirmed appointment for Monday, 06/23/22 at 3:50 PM.  Augustin Schooling, RN

## 2022-06-23 ENCOUNTER — Ambulatory Visit (LOCAL_COMMUNITY_HEALTH_CENTER): Payer: Self-pay

## 2022-06-23 VITALS — Wt 189.0 lb

## 2022-06-23 DIAGNOSIS — R7612 Nonspecific reaction to cell mediated immunity measurement of gamma interferon antigen response without active tuberculosis: Secondary | ICD-10-CM

## 2022-06-23 MED ORDER — RIFAMPIN 300 MG PO CAPS: 600.0000 mg | ORAL_CAPSULE | Freq: Every day | ORAL | 0 refills | Status: AC

## 2022-06-23 NOTE — Progress Notes (Signed)
Patient here for last Rifampin #4. Patient reports no pills left in bottle #3. Patient does not c/o of symptoms.Patient reports feeling great and is  fasting and is happy with her intentional weight loss.    Completion letters reviewed and TB card with dates of treatment given to patient. Dispensed Rifampin 300 mg #60 per Dr Atoka Blas S.O. Explained that it is important to finish her last bottle of Rifampin and then discard empty bottle. No questions at this time.  Augustin Schooling, RN

## 2023-02-11 ENCOUNTER — Ambulatory Visit: Payer: Self-pay | Admitting: Family Medicine
